# Patient Record
Sex: Male | Born: 1940 | Race: White | Hispanic: No | Marital: Married | Smoking: Never smoker
Health system: Southern US, Community
[De-identification: ages and names within clinical notes are randomized; demographics above are authoritative.]

## PROBLEM LIST (undated history)

## (undated) ENCOUNTER — Emergency Department (HOSPITAL_COMMUNITY): Discharge status: Absent without leave | Payer: Self-pay

## (undated) DIAGNOSIS — I4891 Unspecified atrial fibrillation: Secondary | ICD-10-CM

## (undated) DIAGNOSIS — I10 Essential (primary) hypertension: Secondary | ICD-10-CM

## (undated) HISTORY — PX: ATRIAL ABLATION SURGERY: SHX560

## (undated) HISTORY — PX: CATARACT EXTRACTION: SUR2

## (undated) HISTORY — PX: CARDIOVERSION: SHX1299

## (undated) HISTORY — PX: SHOULDER SURGERY: SHX246

---

## 1998-09-03 ENCOUNTER — Ambulatory Visit (HOSPITAL_COMMUNITY): Admission: RE | Admit: 1998-09-03 | Discharge: 1998-09-03 | Payer: Self-pay | Admitting: General Surgery

## 1999-05-02 ENCOUNTER — Ambulatory Visit (HOSPITAL_COMMUNITY): Admission: RE | Admit: 1999-05-02 | Discharge: 1999-05-02 | Payer: Self-pay | Admitting: Internal Medicine

## 1999-05-05 ENCOUNTER — Ambulatory Visit (HOSPITAL_COMMUNITY): Admission: RE | Admit: 1999-05-05 | Discharge: 1999-05-05 | Payer: Self-pay | Admitting: Internal Medicine

## 2000-04-19 ENCOUNTER — Encounter: Admission: RE | Admit: 2000-04-19 | Discharge: 2000-04-19 | Payer: Self-pay | Admitting: Internal Medicine

## 2000-04-19 ENCOUNTER — Encounter: Payer: Self-pay | Admitting: Internal Medicine

## 2000-07-10 ENCOUNTER — Emergency Department (HOSPITAL_COMMUNITY): Admission: EM | Admit: 2000-07-10 | Discharge: 2000-07-10 | Payer: Self-pay | Admitting: Emergency Medicine

## 2000-08-05 ENCOUNTER — Inpatient Hospital Stay (HOSPITAL_COMMUNITY): Admission: AD | Admit: 2000-08-05 | Discharge: 2000-08-08 | Payer: Self-pay | Admitting: Cardiology

## 2000-08-05 ENCOUNTER — Encounter: Payer: Self-pay | Admitting: Cardiology

## 2001-05-26 ENCOUNTER — Encounter: Payer: Self-pay | Admitting: Family Medicine

## 2001-05-26 ENCOUNTER — Encounter: Admission: RE | Admit: 2001-05-26 | Discharge: 2001-05-26 | Payer: Self-pay | Admitting: Family Medicine

## 2001-07-12 ENCOUNTER — Encounter (INDEPENDENT_AMBULATORY_CARE_PROVIDER_SITE_OTHER): Payer: Self-pay | Admitting: *Deleted

## 2001-07-12 ENCOUNTER — Ambulatory Visit (HOSPITAL_BASED_OUTPATIENT_CLINIC_OR_DEPARTMENT_OTHER): Admission: RE | Admit: 2001-07-12 | Discharge: 2001-07-12 | Payer: Self-pay | Admitting: General Surgery

## 2002-01-03 ENCOUNTER — Encounter: Admission: RE | Admit: 2002-01-03 | Discharge: 2002-04-03 | Payer: Self-pay | Admitting: Family Medicine

## 2002-03-22 ENCOUNTER — Encounter: Admission: RE | Admit: 2002-03-22 | Discharge: 2002-03-22 | Payer: Self-pay | Admitting: Internal Medicine

## 2002-03-22 ENCOUNTER — Encounter: Payer: Self-pay | Admitting: Internal Medicine

## 2002-06-01 ENCOUNTER — Encounter: Payer: Self-pay | Admitting: Internal Medicine

## 2002-06-01 ENCOUNTER — Encounter: Admission: RE | Admit: 2002-06-01 | Discharge: 2002-06-01 | Payer: Self-pay | Admitting: Internal Medicine

## 2003-05-28 ENCOUNTER — Encounter: Admission: RE | Admit: 2003-05-28 | Discharge: 2003-05-28 | Payer: Self-pay | Admitting: Internal Medicine

## 2003-06-22 ENCOUNTER — Ambulatory Visit (HOSPITAL_BASED_OUTPATIENT_CLINIC_OR_DEPARTMENT_OTHER): Admission: RE | Admit: 2003-06-22 | Discharge: 2003-06-22 | Payer: Self-pay | Admitting: Critical Care Medicine

## 2003-09-10 ENCOUNTER — Ambulatory Visit (HOSPITAL_BASED_OUTPATIENT_CLINIC_OR_DEPARTMENT_OTHER): Admission: RE | Admit: 2003-09-10 | Discharge: 2003-09-10 | Payer: Self-pay | Admitting: Critical Care Medicine

## 2004-09-10 ENCOUNTER — Encounter: Admission: RE | Admit: 2004-09-10 | Discharge: 2004-09-10 | Payer: Self-pay | Admitting: Internal Medicine

## 2004-09-16 ENCOUNTER — Ambulatory Visit (HOSPITAL_COMMUNITY): Admission: RE | Admit: 2004-09-16 | Discharge: 2004-09-16 | Payer: Self-pay | Admitting: Orthopedic Surgery

## 2004-09-16 ENCOUNTER — Ambulatory Visit (HOSPITAL_BASED_OUTPATIENT_CLINIC_OR_DEPARTMENT_OTHER): Admission: RE | Admit: 2004-09-16 | Discharge: 2004-09-16 | Payer: Self-pay | Admitting: Orthopedic Surgery

## 2006-07-21 ENCOUNTER — Encounter: Admission: RE | Admit: 2006-07-21 | Discharge: 2006-07-21 | Payer: Self-pay | Admitting: Internal Medicine

## 2007-08-03 ENCOUNTER — Emergency Department (HOSPITAL_COMMUNITY): Admission: EM | Admit: 2007-08-03 | Discharge: 2007-08-03 | Payer: Self-pay | Admitting: Emergency Medicine

## 2007-09-14 ENCOUNTER — Ambulatory Visit (HOSPITAL_BASED_OUTPATIENT_CLINIC_OR_DEPARTMENT_OTHER): Admission: RE | Admit: 2007-09-14 | Discharge: 2007-09-14 | Payer: Self-pay | Admitting: Orthopedic Surgery

## 2010-05-20 NOTE — Op Note (Signed)
NAME:  Jacob Browning, Jacob Browning NO.:  [id number]   MEDICAL RECORD NO.:  [id number]          PATIENT TYPE:  AMB   LOCATION:  DSC                          FACILITY:  [facility]   PHYSICIAN:  Leonides Grills, M.D.     DATE OF BIRTH:  1940-11-16   DATE OF PROCEDURE:  09/14/2007  DATE OF DISCHARGE:                               OPERATIVE REPORT   PREOPERATIVE DIAGNOSIS:  Left hallux valgus with gouty arthropathy.   POSTOPERATIVE DIAGNOSIS:  Left hallux valgus with gouty arthropathy.   OPERATION:  1. Left modified McBride bunionectomy.  2. Great toe digital nerve neurolysis.   ANESTHESIA:  General.   SURGEON:  Leonides Grills, MD   ASSISTANT:  Richardean Canal, PA-C   ESTIMATED BLOOD LOSS:  Minimal.   TOURNIQUET TIME:  Approximately half hour.   COMPLICATIONS:  None.   DISPOSITION:  Stable to PR.   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  He was consented to the above procedure.  All risks of  infection, neurovascular injury, recurrence, persistent pain, worsening  pain, possibility of flare-up of gouty, arthropathy, stiffness, and  arthritis were all explained.  Questions were encouraged and answered.   OPERATION:  The patient was brought to the operating room and placed in  supine position.  After adequate general endotracheal anesthesia was  administered as well as Ancef 1 g IV piggyback, left lower extremity was  prepped and draped in the sterile manner over proximally thigh  tourniquet.  The limb was then gravity exsanguinated and the tourniquet  was inflated to 290 mmHg.  A longitudinal incision over the previous  incision was then made.  Dissection was carefully taken down through the  skin.  Hemostasis was obtained.  The dorsomedial digital nerve was  carefully peeled back within the  scar tissue.  There was a bulk gouty  tophus and prominence within the capsule.  This was carefully dissected  out, trying to preserve as much of the capsule was begun.  Once this was  done, we then made an L-shaped capsulotomy and removed any scar tissue  off the medial aspect of the metatarsal head.  We then made another  longitudinal incision on dorsal aspect of first web space of the left  foot.  Dissection was carried down through the skin.  Hemostasis was  obtained.  A lateral capsular release as well as abductor release and  the abductor tendon to sesamoids was also released as well.  This freed  up the sesamoids nicely and had no tension on the toe at the end of  procedure.  We copiously irrigated the area with normal saline and then  went back to the medial capsule and repaired it with 2-0 Vicryl stitch,  holding the toe in a reduced position.  This had an Conservation officer, historic buildings.  The tourniquet was deflated and hemostasis was obtained.  Wound was  copiously irrigated with normal saline.  Skin was closed with  4-0 nylon  stitch.  Sterile dressing was applied.  Roger-Mann dressing was applied.  Hard sole shoe was applied.  The patient was stable to the PR.      Leonides Grills, M.D.  Electronically Signed     PB/MEDQ  D:  09/14/2007  T:  09/15/2007  Job:  [id number]

## 2010-05-23 NOTE — H&P (Signed)
[place]. [facility]  Patient:    Jacob Browning, Jacob Browning                      MRN: [id number] Adm. Date:  08/05/00 Attending:  Darden Palmer., M.D. CC:         Erskine Speed, M.D.                         History and Physical  DATE OF BIRTH: June 29, 1940  REASON FOR ADMISSION: Start medicines for atrial fibrillation.  HISTORY OF PRESENT ILLNESS: This is a 70 year old male, previously healthy, who had atrial fibrillation found on routine physical in April 2002.  He was asymptomatic at that time with only minimal edema.  He did not use caffeine to excess or excess alcohol.  He had an echocardiogram done on May 03, 2000 with EF of 50% and mild concentric LVH.  The right ventricle was upper limits of normal in size.  Left atrial size was 5.3 cm.  It was recommended he be treated since he was asymptomatic with chronic Coumadin therapy and rate control.  We discussed with him the end of April 2002 about staying on Coumadin and rate control with diltiazem 240 mg q.d.  He returned on July 05, 2000 discussing possible cardioversion after Dr. Chilton Si had felt like attempt to get him back into sinus rhythm might be warranted.  The patient did not have shortness of breath or exercise intolerance, but he was concerned about having to take Coumadin because of restrictions on vitamin K and wanted to know about getting off Coumadin in the future.  He had some finger surgery and after this was done he was back on Coumadin.  He wanted to come into the hospital to initiate drug treatment for treatment of atrial fibrillation to try to see if attempt could be made to get him back into normal sinus rhythm. An extensive discussion was had with him prior to this admission about the need for Coumadin therapy even following restoration of sinus rhythm down the road.  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  No history of hypertension, diabetes, previous  heart disease, or ulcers.  PAST SURGICAL HISTORY: Knee operation.  ALLERGIES: None.  CURRENT MEDICATIONS:  1. Coumadin 7.5 mg q.d.  2. Tiazac 240 mg q.d.  FAMILY HISTORY: Negative for premature heart disease.  Father died at age 84 with lung cancer and TB.  Mother died at age 69 accidentally.  No premature cardiac history.  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  Nonsmoker for the past 18 years, previously smoked for 30 years.  Does not use alcohol to excess.  Is married.  REVIEW OF SYSTEMS: Negative.  PHYSICAL EXAMINATION:  GENERAL: He is an obese, pleasant male in no acute distress.  VITAL SIGNS: Blood pressure 120/80, pulse 76.  SKIN: Warm and dry.  HEENT: Normal.  NECK: No carotid bruits or JVD.  LUNGS: Clear.  CARDIAC: Normal S1 and S2, no S3.  ABDOMEN: Soft, nontender.  No masses or organomegaly.  EXTREMITIES: Pulses 2+, no edema.  LABORATORY DATA: Chest x-ray shows mild cardiomegaly.  A 12 lead ECG shows atrial fibrillation with controlled response.  IMPRESSION:  1. Atrial fibrillation.  2. Obesity.  3. Cardiomegaly on chest x-ray.  PLAN: Admission for institution of antiarrhythmic therapy.  We have discussed Tikosyn with him and felt this would be the best efficacious  drug with lowest side effect profile for him.  We will initiate Tikosyn under monitoring and plan cardioversion once he has been on Tikosyn for an adequate period of time. DD:  08/05/00 TD:  08/06/00 Job: [id number]

## 2010-05-23 NOTE — Procedures (Signed)
NAME:  Jacob Browning, SEIER NO.:  [id number]   MEDICAL RECORD NO.:  [id number]          PATIENT TYPE:  OUT   LOCATION:  SLEEP CENTER                 FACILITY:  [facility]   PHYSICIAN:  Marcelyn Bruins, M.D. [facility] DATE OF BIRTH:  10-06-1940   DATE OF ADMISSION:  09/10/2003  DATE OF DISCHARGE:  09/10/2003                              NOCTURNAL POLYSOMNOGRAM   REFERRING PHYSICIAN:  Dr. Shan Levans, M.D.   INDICATION FOR STUDY:  Hypersomnia with sleep apnea.   The patient returns for a CPAP titration study after being diagnosed with  moderate obstructive sleep apnea.   SLEEP ARCHITECTURE:  The patient had total sleep time of 342 minutes with  decreased REM and slow-wave sleep.  Sleep onset latency was normal as was  REM onset.   IMPRESSION:  1.  Good control of previously diagnosed moderate obstructive sleep apnea      with 10 cm of CPAP.  The patient used his mask from home.  There was no      breakthrough snoring.  2.  No clinically-significant cardiac arrhythmias.  3.  Large numbers of leg jerks with significant sleep disruption.  The      patient also complained of pain in his legs during the study. Clinical      correlation is suggested.      KC/MEDQ  D:  09/27/2003 16:06:01  T:  09/28/2003 17:45:01  Job:  [id number]

## 2010-05-23 NOTE — Op Note (Signed)
[place]. [facility]  Patient:    Jacob Browning, Jacob Browning Visit Number: [id number] MRN: [id number]          Service Type: DSU Location: [facility] Attending Physician:  Henrene Dodge Dictated by:   Anselm Pancoast. Zachery Dakins, M.D. Proc. Date: 07/12/01 Admit Date:  07/12/2001 Discharge Date: 07/12/2001   CC:         Dyanne Carrel, M.D.   Operative Report  PREOPERATIVE DIAGNOSIS:  Internal and external hemorrhoids.  POSTOPERATIVE DIAGNOSIS:  Internal and external hemorrhoids.  OPERATION PERFORMED:  Internal and external hemorrhoidectomy.  SURGEON:  Anselm Pancoast. Zachery Dakins, M.D.  ANESTHESIA:  General.  INDICATIONS FOR PROCEDURE:  The patient is a 70 year old Caucasian male who has had extensive long-term problems with internal and external hemorrhoids, has been on Coumadin because of a history of atrial fibrillation.  This is managed by Dr. Donnie Aho and Dr. Manus Gunning referred him to me for the hemorrhoids approximately two months ago.  At that time, I thought that his really enormous internal and external hemorrhoids were such that really a formal internal and external hemorrhoidectomy was what he needed.  He was able to discontinue the Coumadin approximately 10 days ago and wanted to proceed on with surgery at this time.  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  He has had a good intestinal prep.  He has had a previous colonoscopy about a year ago I think by Dr. Dorena Cookey and I am not planning on doing a procto, just a formal internal and external hemorrhoidectomy.  DESCRIPTION OF PROCEDURE:  The patient was taken to the operating suite. Induction of general anesthesia.  He was given 3 gm of Unasyn and placed up in lithotomy position.  The patient has very large hemorrhoids in both the left and right  quadrants.  The anterior hemorrhoid is really to the left of midline and I marked all three areas with Pennington clamps and then I elected to do the anterior one first.  I had prepped the area with Betadine surgical scrub and solution and draped in a sterile manner and anesthetized the internal sphincter area.  It was approximately 20 cc of 0.25% Marcaine with Adrenalin. The external hemorrhoid anteriorly was first elevated.  The internal sphincter was protected.  I then separated the hemorrhoid off the internal sphincter. The little bleeding areas laterally and exteriorly were closed with 3-0 chromic and then the hemorrhoid after it was elevated on its pedicle was underclamped with a Bowie clamp and the hemorrhoid removed.  I sutured the pedicle with 2-0 chromic and then closed the hemorrhoidectomy incision with this continuous running 2-0 chromic suture.  Next the right lateral which was the largest hemorrhoid was performed in a similar manner and the pedicle was underclamped with the Bowie, the hemorrhoid removed and then after the pedicle had been sutured with 2-0 chromic interrupted sutures, I then closed the hemorrhoidectomy incision with a running 2-0 chromic.  The last over on the left was the medium third middle in size was treated in a similar manner.  At the completion it would admit your index finger easily and anoscope was used to inspect all suture lines with good hemostasis.  Then I placed Xylocaine ointment in the anal canal with a gauze kind of tucked into the anal canal to mild hemostasis.  The patient tolerated the procedure nicely  and will be released after a short stay in the recovery room.  He will have Tylox for pain, Xylocaine ointment if needed and can drive when he is no longer needing the Tylox for pain. Dictated by:   Anselm Pancoast. Zachery Dakins, M.D. Attending Physician:  Henrene Dodge DD:  07/12/01 TD:  07/14/01 Job: [id number]

## 2010-05-23 NOTE — Consult Note (Signed)
[facility]  Patient:    Jacob, Browning Visit Number: [id number] MRN: [id number]          Service Type: EMS Location: ED Attending Physician:  Shelba Flake Proc. Date: 07/10/00 Admit Date:  07/10/2000 Discharge Date: 07/10/2000                            Consultation Report  PHYSICIAN REQUESTING CONSULTATION:  Dr. Earlyne Iba.  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  He presents today with bleeding and loss of sensation and decreased motion on his long and ring fingers on his dominant right hand.  He is an otherwise healthy 70 year old male with no known drug allergies, currently on Coumadin for atrial fibrillation, no recent hospitalizations or surgery and no other significant past medical history.  FAMILY MEDICAL HISTORY:  Noncontributory.  SOCIAL HISTORY:  Noncontributory.  PHYSICAL EXAMINATION:  GENERAL:  Well-developed, well-nourished male, pleasant and alert and oriented x 3.  EXTREMITIES:  On examination of his upper extremity on the right, he has a laceration along the volar aspect of his ring finger in the area of the middle phalanx between the DIP and PIP flexion creases.  He has loss of sensation distal to this and also significant weakness in flexion with probable -- at least partial -- profundus tendon laceration.  Superficialis tendon appears to be intact.  He has a superficial abrasion on the little finger.  Long finger has a distal laceration transversely just distal to the DIP flexion crease but he has full tendon function and minimal loss of sensation.  DESCRIPTION OF PROCEDURE:  He was given 2% plain lidocaine digital sheath blocks.  The wounds were irrigated and debrided under sterile conditions, then loosely closed with  4-0 nylon on the long and ring fingers.  DISPOSITION:  He was discharged from the emergency department with Keflex 500 mg one p.o. q.i.d.  He was given Vicodin for pain.  FOLLOWUP:  He is to follow up in my office on July 20, 2000 for a recheck. DISCHARGE INSTRUCTIONS:  Should he have signs of loss of sensation and loss of flexion, he may require exploration and tendon, nerve and artery repair as necessary.  He is to call my office immediately for any signs of infection, streaking up the arm, adenopathy, fever, chills, etc. Attending Physician:  Shelba Flake DD:  07/10/00 TD:  07/11/00 Job: [id number]

## 2010-05-23 NOTE — Op Note (Signed)
NAME:  Jacob Browning, Jacob Browning NO.:  [id number]   MEDICAL RECORD NO.:  [id number]          PATIENT TYPE:  AMB   LOCATION:  DSC                          FACILITY:  [facility]   PHYSICIAN:  Leonides Grills, M.D.     DATE OF BIRTH:  Aug 24, 1940   DATE OF PROCEDURE:  09/16/2004  DATE OF DISCHARGE:                                 OPERATIVE REPORT   PREOPERATIVE DIAGNOSIS:  1.  Left hallux valgus.  2.  Left third metatarsalgia.  3.  Left third hammer toe.   POSTOPERATIVE DIAGNOSIS:  1.  Left hallux valgus.  2.  Left third metatarsalgia.  3.  Left third hammer toe.   OPERATION:  1.  Left Chevron bunionectomy.  2.  Stress x-rays of the left foot.  3.  Left third metatarsal shortening dorsiflexion osteotomy.  4.  Left third toe MTP joint dorsal capsulotomy with collateral release.  5.  Left third toe EDP TEL tendon transfer.   ANESTHESIA:  General with block.   SURGEON:  Leonides Grills, M.D.   ASSISTANT:  Lianne Cure, P.A.-C.   ESTIMATED BLOOD LOSS:  Minimal.   TOURNIQUET TIME:  Approximately one hour.   COMPLICATIONS:  None.   DISPOSITION:  Stable to the PR.   INDICATIONS FOR PROCEDURE:  This is a 70 year old male with long-standing  forefoot pain since a [place] war injury.  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  He was consented for the above  procedure.  All risks which included infection, neurovascular injury,  nonunion, malunion, hardware rotation, hardware failure, persistent pain,  worsening pain, prolonged recovery, stiffness, arthritis, were all  explained.  Questions were encouraged and answered.   OPERATION:  The patient was brought to the operating room and placed in  supine position.  After adequate general anesthesia was administered as well  as Ancef 1 gram IV piggyback, the left lower extremity was then prepped and  draped in a sterile manner  over a proximally placed thigh tourniquet.  The  limb was gravity exsanguinated and tourniquet elevated to 290 mmHg.  A  longitudinal incision on the medial midline aspect of the great toe MTP  joint was then made, dissection was carried down through the skin, and  hemostasis was obtained.  Neurovascular sections were identified both  superiorly and inferiorly and protected throughout the case.  An L-shaped  capsulotomy was then performed.  The capsule was very tight.  Simple  bunionectomy was then performed with a sagittal saw.  Lateral capsule was  then released but this took quite sometime due to the fact that the capsule  was so tight and there was almost looked to be tophaceous type material  around this area.  Once this was completely removed and loose, we then  elevated the soft tissues both superiorly and inferiorly, protected them,  and performed the Chevron osteotomy centering in the medial aspect of the  metatarsal head.  The head was then translated approximately 3 mm laterally  and then fixed with 2 mm  full threaded cortical set screw using 1.5 mm drill  holes respectively.  This was counter sunk.  Redundant bone medially was  then osteotomized off the sagittal saw.  The Alona Bene ridge was then  rounded off with a rongeur.  We then copiously irrigated the joint and area  with normal saline.  The capsule was then advanced both superiorly and  proximally repaired with 2-0 Vicryl suture.  We then obtained stress x-rays  in the AP and lateral planes, this showed that there was no gross motion  across the osteotomy site, fixation proper position, toe was in excellent  alignment, as well.  The area was copiously irrigated with saline.   I then made a longitudinal incision over the third metatarsal head,  dissection was carried down through skin and hemostasis was obtained.  The  extensor digitorum longus and brevis tendons were identified and there was a  lot of scar tissue in  this area.  Again, the tendons were freed up and then  the extensor digitorum brevis was tenotomized proximal medial and the brevis  distal lateral retracted out of harms way for later tendon transfer.  We  then performed a dorsal capsulotomy with collateral release with a 15 blade  scalpel.  This had excellent release to the entire capsule.  We then plantar  flexed the toe.  Our goal for this procedure was to not only slightly  shorten the metatarsal but also dorsiflex, it is why we did an oblique  closing wedge osteotomy using stacked sagittal saw blades.  From  preoperative planning, it was thought that the head was approximately 6 mm  depressed.  We took three shavings in an oblique plane of the metatarsal  shaft and then repaired the head and the osteotomy with a 2-0 fully threaded  cortical set screw using 2 and 1.5 mm drill holes respectively essentially  creating a lag screw affect across the osteotomy.  This had an excellent  purchase and maintenance of the correction.  The head was palpated through  the skin plantar and was completely decompressed where it was prominent  previously.  The area and joint were copiously irrigated with normal saline.  We then completed the transfer of the EDP TEL using a 3-0 PDS suture.  Final  x-rays were obtained in the AP, lateral, and oblique planes, and showed that  the fixation was in the proper position, no gross motion across the  osteotomy sites with stress, as well.   The tourniquet was deflated, hemostasis was obtained.  All wounds were  copiously irrigated with normal saline.  The subcu was closed with 3-0  Vicryl, skin was closed with 4-0 nylon.  A sterile dressing was applied.  A  Roger Man dressing was applied.  Hartzel shoes.  Patient stable to the PR.      Leonides Grills, M.D.  Electronically Signed     PB/MEDQ  D:  09/16/2004  T:  09/16/2004  Job:  [id number]

## 2010-05-23 NOTE — Discharge Summary (Signed)
[place]. [facility]  Patient:    Jacob Browning, Jacob Browning                    MRN: [id number] Adm. Date:  [id number] Disc. Date: [id number] Attending:  Norman Clay CC:         Erskine Speed, M.D.   Discharge Summary  FINAL DIAGNOSIS:  Atrial fibrillation - resolved.  HISTORY OF PRESENT ILLNESS:  The patient is a 70 year old male previously healthy who had atrial fibrillation.  He had a routine physical examination in April of 2002.  Echocardiogram showed mild concentric LVH, with left atrial size of 5.3.  He was begun on Coumadin and rate control at that time since he was asymptomatic.  He later wished to have attempts to get back into sinus rhythm and was brought into the hospital to initiate antiarrhythmic therapy. Please see the previously dictated history and physical for the remainder of the details.  HOSPITAL COURSE:  X-ray shows mild cardiac enlargement.  Initial EKG showed normal QTC, atrial fibrillation, controlled response.  Protime/INR was 2.2, magnesium was 2, sodium 139, potassium 4.4, BUN and creatinine normal.  CBC was normal.  The patient was brought into the hospital and initiated on Tikosyn 500 mg q.12h.  per protocol. QTC did not prolong significantly.  He converted back to sinus rhythm after the first dose and was watched in the hospital for three days, had no proarrhythmia on it.  He was discharged in improved condition on Tikosyn 500 mg q.12h. and Coumadin 7.5 mg daily.  He is to stop metoprolol 50 b.i.d.  He was instructed regarding Tikosyn.  He was instructed not to use cimetidine or other contraindicated drugs such as verapamil, ketoconazole, trimethoprim Sulphamethoxazole, or HCTZ.  He is to see me in the office for follow-up appointment in two weeks.  He is to walk daily. After a month on Coumadin, since he does not have any other risk factors for embolism, we may consider stopping his Coumadin and place him on aspirin  alone. DD:  08/08/00 TD:  08/10/00 Job: [id number]

## 2010-06-12 ENCOUNTER — Ambulatory Visit
Admission: RE | Admit: 2010-06-12 | Discharge: 2010-06-12 | Disposition: A | Discharge status: Home / Self Care | Payer: Medicare Other | Source: Ambulatory Visit | Attending: Internal Medicine | Admitting: Internal Medicine

## 2010-06-12 ENCOUNTER — Other Ambulatory Visit: Payer: Self-pay | Admitting: Internal Medicine

## 2010-06-12 DIAGNOSIS — R05 Cough: Secondary | ICD-10-CM

## 2010-06-12 DIAGNOSIS — M79673 Pain in unspecified foot: Secondary | ICD-10-CM

## 2010-10-08 LAB — APTT: aPTT: 28

## 2010-10-08 LAB — BASIC METABOLIC PANEL
Calcium: 8.9
GFR calc non Af Amer: >60
Glucose, Bld: 91
Sodium: 139

## 2010-12-19 ENCOUNTER — Other Ambulatory Visit: Payer: Self-pay | Admitting: Gastroenterology

## 2014-01-18 DIAGNOSIS — D3132 Benign neoplasm of left choroid: Secondary | ICD-10-CM | POA: Diagnosis not present

## 2014-01-30 DIAGNOSIS — I10 Essential (primary) hypertension: Secondary | ICD-10-CM | POA: Diagnosis not present

## 2014-01-30 DIAGNOSIS — I480 Paroxysmal atrial fibrillation: Secondary | ICD-10-CM | POA: Diagnosis not present

## 2014-01-30 DIAGNOSIS — I4891 Unspecified atrial fibrillation: Secondary | ICD-10-CM | POA: Diagnosis not present

## 2014-01-30 DIAGNOSIS — Z87891 Personal history of nicotine dependence: Secondary | ICD-10-CM | POA: Diagnosis not present

## 2014-01-30 DIAGNOSIS — Z7901 Long term (current) use of anticoagulants: Secondary | ICD-10-CM | POA: Diagnosis not present

## 2014-01-30 DIAGNOSIS — R002 Palpitations: Secondary | ICD-10-CM | POA: Diagnosis not present

## 2014-02-16 DIAGNOSIS — L820 Inflamed seborrheic keratosis: Secondary | ICD-10-CM | POA: Diagnosis not present

## 2014-02-16 DIAGNOSIS — L570 Actinic keratosis: Secondary | ICD-10-CM | POA: Diagnosis not present

## 2014-02-16 DIAGNOSIS — B079 Viral wart, unspecified: Secondary | ICD-10-CM | POA: Diagnosis not present

## 2014-02-16 DIAGNOSIS — L821 Other seborrheic keratosis: Secondary | ICD-10-CM | POA: Diagnosis not present

## 2014-03-20 DIAGNOSIS — M25612 Stiffness of left shoulder, not elsewhere classified: Secondary | ICD-10-CM | POA: Diagnosis not present

## 2014-04-17 DIAGNOSIS — B079 Viral wart, unspecified: Secondary | ICD-10-CM | POA: Diagnosis not present

## 2014-06-12 DIAGNOSIS — I480 Paroxysmal atrial fibrillation: Secondary | ICD-10-CM | POA: Diagnosis not present

## 2014-06-12 DIAGNOSIS — Z87891 Personal history of nicotine dependence: Secondary | ICD-10-CM | POA: Diagnosis not present

## 2014-06-12 DIAGNOSIS — Z5181 Encounter for therapeutic drug level monitoring: Secondary | ICD-10-CM | POA: Diagnosis not present

## 2014-06-12 DIAGNOSIS — I10 Essential (primary) hypertension: Secondary | ICD-10-CM | POA: Diagnosis not present

## 2014-06-12 DIAGNOSIS — I4891 Unspecified atrial fibrillation: Secondary | ICD-10-CM | POA: Diagnosis not present

## 2014-06-12 DIAGNOSIS — Z79899 Other long term (current) drug therapy: Secondary | ICD-10-CM | POA: Diagnosis not present

## 2014-06-29 DIAGNOSIS — M109 Gout, unspecified: Secondary | ICD-10-CM | POA: Diagnosis not present

## 2014-06-29 DIAGNOSIS — I4891 Unspecified atrial fibrillation: Secondary | ICD-10-CM | POA: Diagnosis not present

## 2014-06-29 DIAGNOSIS — Z6834 Body mass index (BMI) 34.0-34.9, adult: Secondary | ICD-10-CM | POA: Diagnosis not present

## 2014-06-29 DIAGNOSIS — Z0000 Encounter for general adult medical examination without abnormal findings: Secondary | ICD-10-CM | POA: Diagnosis not present

## 2014-07-02 DIAGNOSIS — I10 Essential (primary) hypertension: Secondary | ICD-10-CM | POA: Diagnosis not present

## 2014-07-13 DIAGNOSIS — I10 Essential (primary) hypertension: Secondary | ICD-10-CM | POA: Diagnosis not present

## 2014-07-20 DIAGNOSIS — I10 Essential (primary) hypertension: Secondary | ICD-10-CM | POA: Diagnosis not present

## 2014-07-20 DIAGNOSIS — I4891 Unspecified atrial fibrillation: Secondary | ICD-10-CM | POA: Diagnosis not present

## 2014-10-19 DIAGNOSIS — R001 Bradycardia, unspecified: Secondary | ICD-10-CM | POA: Diagnosis not present

## 2014-10-19 DIAGNOSIS — Z23 Encounter for immunization: Secondary | ICD-10-CM | POA: Diagnosis not present

## 2014-10-19 DIAGNOSIS — I10 Essential (primary) hypertension: Secondary | ICD-10-CM | POA: Diagnosis not present

## 2014-11-19 ENCOUNTER — Emergency Department (HOSPITAL_BASED_OUTPATIENT_CLINIC_OR_DEPARTMENT_OTHER)
Admission: EM | Admit: 2014-11-19 | Discharge: 2014-11-19 | Disposition: A | Discharge status: Home / Self Care | Payer: Medicare Other | Attending: Emergency Medicine | Admitting: Emergency Medicine

## 2014-11-19 ENCOUNTER — Encounter (HOSPITAL_BASED_OUTPATIENT_CLINIC_OR_DEPARTMENT_OTHER): Payer: Self-pay

## 2014-11-19 ENCOUNTER — Emergency Department (HOSPITAL_BASED_OUTPATIENT_CLINIC_OR_DEPARTMENT_OTHER): Payer: Medicare Other

## 2014-11-19 DIAGNOSIS — Z7902 Long term (current) use of antithrombotics/antiplatelets: Secondary | ICD-10-CM | POA: Insufficient documentation

## 2014-11-19 DIAGNOSIS — S0083XA Contusion of other part of head, initial encounter: Secondary | ICD-10-CM | POA: Insufficient documentation

## 2014-11-19 DIAGNOSIS — W01198A Fall on same level from slipping, tripping and stumbling with subsequent striking against other object, initial encounter: Secondary | ICD-10-CM | POA: Diagnosis not present

## 2014-11-19 DIAGNOSIS — S0990XA Unspecified injury of head, initial encounter: Secondary | ICD-10-CM

## 2014-11-19 DIAGNOSIS — I4891 Unspecified atrial fibrillation: Secondary | ICD-10-CM | POA: Diagnosis not present

## 2014-11-19 DIAGNOSIS — Y998 Other external cause status: Secondary | ICD-10-CM | POA: Diagnosis not present

## 2014-11-19 DIAGNOSIS — Y9289 Other specified places as the place of occurrence of the external cause: Secondary | ICD-10-CM | POA: Insufficient documentation

## 2014-11-19 DIAGNOSIS — I10 Essential (primary) hypertension: Secondary | ICD-10-CM | POA: Diagnosis not present

## 2014-11-19 DIAGNOSIS — Y9301 Activity, walking, marching and hiking: Secondary | ICD-10-CM | POA: Insufficient documentation

## 2014-11-19 DIAGNOSIS — Z79899 Other long term (current) drug therapy: Secondary | ICD-10-CM | POA: Insufficient documentation

## 2014-11-19 DIAGNOSIS — W19XXXA Unspecified fall, initial encounter: Secondary | ICD-10-CM

## 2014-11-19 HISTORY — DX: Essential (primary) hypertension: I10

## 2014-11-19 HISTORY — DX: Unspecified atrial fibrillation: I48.91

## 2014-11-19 NOTE — ED Notes (Signed)
Pt tripped and fell hit back of head on side of house  No loc  Hematoma noted  Pupils 3 and brisk    Ice applied to head

## 2014-11-19 NOTE — ED Provider Notes (Signed)
CSN: [id number]     Arrival date & time 11/19/14  1837 History   First MD Initiated Contact with Patient 11/19/14 2039     Chief Complaint  Patient presents with  . Head Injury   Jacob Browning is a 74 y.o. male with a history of atrophic fibrillation and hypertension and is on Eliquis who presents to the emergency department after a trip and fall hitting the back of his head around 5:30 PM tonight. Patient reports he was walking backwards when he tripped and fell backwards onto the side of his house being the back of his head on the side of the house. He denies loss of consciousness. He currently complains of a 3/10 headache directly where he hit the back of his head. He denies feeling lightheaded or dizzy. He has taken nothing for treatment today. The patient denies fevers, recent illness, neck pain, numbness, tingling, weakness, double vision, changes to his vision, chest pain, shortness of breath, palpitations, abdominal pain, nausea, vomiting, diarrhea, new back pain, urinary symptoms, lightheadedness, dizziness, or syncope. He denies hematuria, easy bruising, easy bleeding, gum bleeding, or hematochezia. He denies loss of bowel or bladder control.  (Consider location/radiation/quality/duration/timing/severity/associated sxs/prior Treatment) HPI  Past Medical History  Diagnosis Date  . A-fib ([place])   . Hypertension    Past Surgical History  Procedure Laterality Date  . Atrial ablation surgery     No family history on file. Social History  Substance Use Topics  . Smoking status: Never Smoker   . Smokeless tobacco: None  . Alcohol Use: No    Review of Systems  Constitutional: Negative for fever and chills.  HENT: Negative for ear discharge, ear pain, hearing loss, nosebleeds and tinnitus.   Eyes: Negative for pain and visual disturbance.  Respiratory: Negative for cough, shortness of breath and wheezing.   Cardiovascular: Negative for chest pain and palpitations.   Gastrointestinal: Negative for nausea, vomiting, abdominal pain, diarrhea and blood in stool.  Genitourinary: Negative for dysuria, hematuria and difficulty urinating.  Musculoskeletal: Negative for back pain, neck pain and neck stiffness.  Skin: Negative for rash and wound.  Neurological: Positive for headaches. Negative for dizziness, seizures, syncope, speech difficulty, weakness, light-headedness and numbness.      Allergies  Review of patient's allergies indicates no known allergies.  Home Medications   Prior to Admission medications   Medication Sig Start Date End Date Taking? Authorizing Provider  apixaban (ELIQUIS) 5 MG TABS tablet Take 5 mg by mouth 2 (two) times daily.   Yes Historical Provider, MD  METOPROLOL TARTRATE PO Take by mouth.   Yes Historical Provider, MD  sotalol (BETAPACE) 120 MG tablet Take 120 mg by mouth 2 (two) times daily.   Yes Historical Provider, MD   BP 159/82 mmHg  Pulse 51  Temp(Src) 98.4 F (36.9 C) (Oral)  Resp 18  Ht 6\' 1"  (1.854 m)  Wt 246 lb (111.585 kg)  BMI 32.46 kg/m2  SpO2 93% Physical Exam  Constitutional: He is oriented to person, place, and time. He appears well-developed and well-nourished. No distress.  HENT:  Head: Normocephalic.  Right Ear: External ear normal.  Left Ear: External ear normal.  Nose: Nose normal.  Mouth/Throat: Oropharynx is clear and moist.  3 cm hematoma to his posterior head. No crepitus. No other evidence of head trauma. No bleeding, abrasions or lacerations.  Bilateral tympanic membranes are pearly-gray without erythema or loss of landmarks. No ear discharge.  Eyes: Conjunctivae and EOM are normal. Pupils are  equal, round, and reactive to light. Right eye exhibits no discharge. Left eye exhibits no discharge.  EOMs are intact bilaterally.  Neck: Normal range of motion. Neck supple. No JVD present. No tracheal deviation present.  No midline neck tenderness to palpation.  Cardiovascular: Normal rate,  regular rhythm, normal heart sounds and intact distal pulses.  Exam reveals no gallop and no friction rub.   No murmur heard. Pulmonary/Chest: Effort normal and breath sounds normal. No respiratory distress. He has no wheezes. He has no rales. He exhibits no tenderness.  Abdominal: Soft. He exhibits no distension. There is no tenderness. There is no guarding.  Musculoskeletal: Normal range of motion. He exhibits no edema or tenderness.  No midline neck or back tenderness. 5 out of 5 strength in his bilateral upper and lower extremities.  Lymphadenopathy:    He has no cervical adenopathy.  Neurological: He is alert and oriented to person, place, and time. No cranial nerve deficit. Coordination normal.  The patient is alert and oriented 3. Cranial nerves are intact. No pronator drift. Finger to nose intact bilaterally. EOMs intact bilaterally. Vision is grossly intact. Sensation is intact in his bilateral upper and lower extremities. Gait is normal. Speech is clear and coherent.  Skin: Skin is warm and dry. No rash noted. He is not diaphoretic. No erythema. No pallor.  Psychiatric: He has a normal mood and affect. His behavior is normal.  Nursing note and vitals reviewed.   ED Course  Procedures (including critical care time) Labs Review Labs Reviewed - No data to display  Imaging Review Ct Head Wo Contrast  11/19/2014  CLINICAL DATA:  Pt states that he was standing still and fell backwards hitting back of head against brick wall around 530pm pt states that he is on blood thinners, denies loc EXAM: CT HEAD WITHOUT CONTRAST TECHNIQUE: Contiguous axial images were obtained from the base of the skull through the vertex without intravenous contrast. COMPARISON:  04/11/2005 FINDINGS: There is no evidence of acute intracranial hemorrhage, brain edema, mass lesion, acute infarction, mass effect, or midline shift. Acute infarct may be inapparent on noncontrast CT. No other intra-axial abnormalities are  seen, and the ventricles and sulci are within normal limits in size and symmetry. No abnormal extra-axial fluid collections or masses are identified. No significant calvarial abnormality. IMPRESSION: 1. Negative for bleed or other acute intracranial process. Electronically Signed   By: Lucrezia Europe M.D.   On: 11/19/2014 19:13   I have personally reviewed and evaluated these images as part of my medical decision-making.   EKG Interpretation None      Filed Vitals:   11/19/14 1848 11/19/14 1953  BP: 168/77 159/82  Pulse: 54 51  Temp: 98.4 F (36.9 C)   TempSrc: Oral   Resp: 16 18  Height: 6\' 1"  (1.854 m)   Weight: 246 lb (111.585 kg)   SpO2: 100% 93%     MDM   Meds given in ED:  Medications - No data to display  New Prescriptions   No medications on file    Final diagnoses:  Closed head injury, initial encounter  Fall, initial encounter   This  is a 74 y.o. male with a history of atrophic fibrillation and hypertension and is on Eliquis who presents to the emergency department after a trip and fall hitting the back of his head around 5:30 PM tonight. Patient reports he was walking backwards when he tripped and fell backwards onto the side of his house being the  back of his head on the side of the house. He denies loss of consciousness. He currently complains of a 3/10 headache directly where he hit the back of his head. He denies feeling lightheaded or dizzy.  On exam the patient is afebrile nontoxic appearing. He has no focal neurological deficits. He has a 3 cm hematoma to his posterior head. No evidence of other head injury. No midline back or neck tenderness. Patient's head CT is unremarkable. Will discharge with strict return precautions. I advised head injury return precautions. I advised the patient to follow-up with their primary care provider this week. I advised the patient to return to the emergency department with new or worsening symptoms or new concerns. The patient  verbalized understanding and agreement with plan.     This patient was discussed with and evaluated by Dr. Alfonse Spruce who agrees with this assessment and plan.    Waynetta Pean, PA-C 11/19/14 2113  Harvel Quale, MD 11/20/14 0800

## 2014-11-19 NOTE — ED Notes (Addendum)
Tripped/fell approx 530pm-hit back of head-no break in skin-hematoma noted-denies LOC-steady gait

## 2014-11-19 NOTE — Discharge Instructions (Signed)
Head Injury, Adult You have received a head injury. It does not appear serious at this time. Headaches and vomiting are common following head injury. It should be easy to awaken from sleeping. Sometimes it is necessary for you to stay in the emergency department for a while for observation. Sometimes admission to the hospital may be needed. After injuries such as yours, most problems occur within the first 24 hours, but side effects may occur up to 7-10 days after the injury. It is important for you to carefully monitor your condition and contact your health care provider or seek immediate medical care if there is a change in your condition. WHAT ARE THE TYPES OF HEAD INJURIES? Head injuries can be as minor as a bump. Some head injuries can be more severe. More severe head injuries include:  A jarring injury to the brain (concussion).  A bruise of the brain (contusion). This mean there is bleeding in the brain that can cause swelling.  A cracked skull (skull fracture).  Bleeding in the brain that collects, clots, and forms a bump (hematoma). WHAT CAUSES A HEAD INJURY? A serious head injury is most likely to happen to someone who is in a car wreck and is not wearing a seat belt. Other causes of major head injuries include bicycle or motorcycle accidents, sports injuries, and falls. HOW ARE HEAD INJURIES DIAGNOSED? A complete history of the event leading to the injury and your current symptoms will be helpful in diagnosing head injuries. Many times, pictures of the brain, such as CT or MRI are needed to see the extent of the injury. Often, an overnight hospital stay is necessary for observation.  WHEN SHOULD I SEEK IMMEDIATE MEDICAL CARE?  You should get help right away if:  You have confusion or drowsiness.  You feel sick to your stomach (nauseous) or have continued, forceful vomiting.  You have dizziness or unsteadiness that is getting worse.  You have severe, continued headaches not  relieved by medicine. Only take over-the-counter or prescription medicines for pain, fever, or discomfort as directed by your health care provider.  You do not have normal function of the arms or legs or are unable to walk.  You notice changes in the black spots in the center of the colored part of your eye (pupil).  You have a clear or bloody fluid coming from your nose or ears.  You have a loss of vision. During the next 24 hours after the injury, you must stay with someone who can watch you for the warning signs. This person should contact local emergency services (911 in the [place] if you have seizures, you become unconscious, or you are unable to wake up. HOW CAN I PREVENT A HEAD INJURY IN THE FUTURE? The most important factor for preventing major head injuries is avoiding motor vehicle accidents. To minimize the potential for damage to your head, it is crucial to wear seat belts while riding in motor vehicles. Wearing helmets while bike riding and playing collision sports (like football) is also helpful. Also, avoiding dangerous activities around the house will further help reduce your risk of head injury.  WHEN CAN I RETURN TO NORMAL ACTIVITIES AND ATHLETICS? You should be reevaluated by your health care provider before returning to these activities. If you have any of the following symptoms, you should not return to activities or contact sports until 1 week after the symptoms have stopped:  Persistent headache.  Dizziness or vertigo.  Poor attention and concentration.  Confusion.  Memory problems. °· Nausea or vomiting. °· Fatigue or tire easily. °· Irritability. °· Intolerant of bright lights or loud noises. °· Anxiety or depression. °· Disturbed sleep. °MAKE SURE YOU:  °· Understand these instructions. °· Will watch your condition. °· Will get help right away if you are not doing well or get worse. °  °This information is not intended to replace advice given to you by your health  care provider. Make sure you discuss any questions you have with your health care provider. °  °Document Released: 12/22/2004 Document Revised: 01/12/2014 Document Reviewed: 08/29/2012 °Elsevier Interactive Patient Education ©2016 [employer] ° ° ° °Hematoma °A hematoma is a collection of blood under the skin, in an organ, in a body space, in a joint space, or in other tissue. The blood can clot to form a lump that you can see and feel. The lump is often firm and may sometimes become sore and tender. Most hematomas get better in a few days to weeks. However, some hematomas may be serious and require medical care. Hematomas can range in size from very small to very large. °CAUSES  °A hematoma can be caused by a blunt or penetrating injury. It can also be caused by spontaneous leakage from a blood vessel under the skin. Spontaneous leakage from a blood vessel is more likely to occur in older people, especially those taking blood thinners. Sometimes, a hematoma can develop after certain medical procedures. °SIGNS AND SYMPTOMS  °· A firm lump on the body. °· Possible pain and tenderness in the area. °· Bruising. Blue, dark blue, purple-red, or yellowish skin may appear at the site of the hematoma if the hematoma is close to the surface of the skin. °For hematomas in deeper tissues or body spaces, the signs and symptoms may be subtle. For example, an intra-abdominal hematoma may cause abdominal pain, weakness, fainting, and shortness of breath. An intracranial hematoma may cause a headache or symptoms such as weakness, trouble speaking, or a change in consciousness. °DIAGNOSIS  °A hematoma can usually be diagnosed based on your medical history and a physical exam. Imaging tests may be needed if your health care provider suspects a hematoma in deeper tissues or body spaces, such as the abdomen, head, or chest. These tests may include ultrasonography or a CT scan.  °TREATMENT  °Hematomas usually go away on their own over  time. Rarely does the blood need to be drained out of the body. Large hematomas or those that may affect vital organs will sometimes need surgical drainage or monitoring. °HOME CARE INSTRUCTIONS  °· Apply ice to the injured area:   °¨ Put ice in a plastic bag.   °¨ Place a towel between your skin and the bag.   °¨ Leave the ice on for 20 minutes, 2-3 times a day for the first 1 to 2 days.   °· After the first 2 days, switch to using warm compresses on the hematoma.   °· Elevate the injured area to help decrease pain and swelling. Wrapping the area with an elastic bandage may also be helpful. Compression helps to reduce swelling and promotes shrinking of the hematoma. Make sure the bandage is not wrapped too tight.   °· If your hematoma is on a lower extremity and is painful, crutches may be helpful for a couple days.   °· Only take over-the-counter or prescription medicines as directed by your health care provider. °SEEK IMMEDIATE MEDICAL CARE IF:  °· You have increasing pain, or your pain is not controlled with medicine.   °·   You have a fever.   °· You have worsening swelling or discoloration.   °· Your skin over the hematoma breaks or starts bleeding.   °· Your hematoma is in your chest or abdomen and you have weakness, shortness of breath, or a change in consciousness. °· Your hematoma is on your scalp (caused by a fall or injury) and you have a worsening headache or a change in alertness or consciousness. °MAKE SURE YOU:  °· Understand these instructions. °· Will watch your condition. °· Will get help right away if you are not doing well or get worse. °  °This information is not intended to replace advice given to you by your health care provider. Make sure you discuss any questions you have with your health care provider. °  °Document Released: 08/06/2003 Document Revised: 08/24/2012 Document Reviewed: 06/01/2012 °Elsevier Interactive Patient Education ©2016 [employer] ° °

## 2014-12-12 DIAGNOSIS — I10 Essential (primary) hypertension: Secondary | ICD-10-CM | POA: Diagnosis not present

## 2014-12-12 DIAGNOSIS — Z87891 Personal history of nicotine dependence: Secondary | ICD-10-CM | POA: Diagnosis not present

## 2014-12-12 DIAGNOSIS — I4891 Unspecified atrial fibrillation: Secondary | ICD-10-CM | POA: Diagnosis not present

## 2014-12-12 DIAGNOSIS — Z79899 Other long term (current) drug therapy: Secondary | ICD-10-CM | POA: Diagnosis not present

## 2014-12-12 DIAGNOSIS — Z7901 Long term (current) use of anticoagulants: Secondary | ICD-10-CM | POA: Diagnosis not present

## 2014-12-12 DIAGNOSIS — I480 Paroxysmal atrial fibrillation: Secondary | ICD-10-CM | POA: Diagnosis not present

## 2014-12-12 DIAGNOSIS — Z5181 Encounter for therapeutic drug level monitoring: Secondary | ICD-10-CM | POA: Diagnosis not present

## 2014-12-12 DIAGNOSIS — I444 Left anterior fascicular block: Secondary | ICD-10-CM | POA: Diagnosis not present

## 2014-12-12 DIAGNOSIS — R001 Bradycardia, unspecified: Secondary | ICD-10-CM | POA: Diagnosis not present

## 2014-12-12 DIAGNOSIS — I440 Atrioventricular block, first degree: Secondary | ICD-10-CM | POA: Diagnosis not present

## 2014-12-27 DIAGNOSIS — H903 Sensorineural hearing loss, bilateral: Secondary | ICD-10-CM | POA: Diagnosis not present

## 2015-02-22 DIAGNOSIS — I4891 Unspecified atrial fibrillation: Secondary | ICD-10-CM | POA: Diagnosis not present

## 2015-02-22 DIAGNOSIS — I10 Essential (primary) hypertension: Secondary | ICD-10-CM | POA: Diagnosis not present

## 2015-04-11 DIAGNOSIS — I119 Hypertensive heart disease without heart failure: Secondary | ICD-10-CM | POA: Diagnosis not present

## 2015-04-11 DIAGNOSIS — I4891 Unspecified atrial fibrillation: Secondary | ICD-10-CM | POA: Diagnosis not present

## 2015-04-17 DIAGNOSIS — I10 Essential (primary) hypertension: Secondary | ICD-10-CM | POA: Diagnosis not present

## 2015-04-17 DIAGNOSIS — I452 Bifascicular block: Secondary | ICD-10-CM | POA: Diagnosis not present

## 2015-04-17 DIAGNOSIS — I480 Paroxysmal atrial fibrillation: Secondary | ICD-10-CM | POA: Diagnosis not present

## 2015-04-17 DIAGNOSIS — I440 Atrioventricular block, first degree: Secondary | ICD-10-CM | POA: Diagnosis not present

## 2015-04-17 DIAGNOSIS — Z7901 Long term (current) use of anticoagulants: Secondary | ICD-10-CM | POA: Diagnosis not present

## 2015-04-17 DIAGNOSIS — I4891 Unspecified atrial fibrillation: Secondary | ICD-10-CM | POA: Diagnosis not present

## 2015-04-17 DIAGNOSIS — Z87891 Personal history of nicotine dependence: Secondary | ICD-10-CM | POA: Diagnosis not present

## 2015-04-17 DIAGNOSIS — Z5181 Encounter for therapeutic drug level monitoring: Secondary | ICD-10-CM | POA: Diagnosis not present

## 2015-04-17 DIAGNOSIS — Z79899 Other long term (current) drug therapy: Secondary | ICD-10-CM | POA: Diagnosis not present

## 2015-07-02 DIAGNOSIS — I152 Hypertension secondary to endocrine disorders: Secondary | ICD-10-CM | POA: Diagnosis not present

## 2015-07-02 DIAGNOSIS — D559 Anemia due to enzyme disorder, unspecified: Secondary | ICD-10-CM | POA: Diagnosis not present

## 2015-07-02 DIAGNOSIS — Z125 Encounter for screening for malignant neoplasm of prostate: Secondary | ICD-10-CM | POA: Diagnosis not present

## 2015-10-16 DIAGNOSIS — Z87891 Personal history of nicotine dependence: Secondary | ICD-10-CM | POA: Diagnosis not present

## 2015-10-16 DIAGNOSIS — I4891 Unspecified atrial fibrillation: Secondary | ICD-10-CM | POA: Diagnosis not present

## 2015-10-16 DIAGNOSIS — Z7901 Long term (current) use of anticoagulants: Secondary | ICD-10-CM | POA: Diagnosis not present

## 2015-10-16 DIAGNOSIS — I10 Essential (primary) hypertension: Secondary | ICD-10-CM | POA: Diagnosis not present

## 2015-10-16 DIAGNOSIS — Z79899 Other long term (current) drug therapy: Secondary | ICD-10-CM | POA: Diagnosis not present

## 2015-10-16 DIAGNOSIS — I480 Paroxysmal atrial fibrillation: Secondary | ICD-10-CM | POA: Diagnosis not present

## 2015-10-16 DIAGNOSIS — I440 Atrioventricular block, first degree: Secondary | ICD-10-CM | POA: Diagnosis not present

## 2015-10-16 DIAGNOSIS — Z5181 Encounter for therapeutic drug level monitoring: Secondary | ICD-10-CM | POA: Diagnosis not present

## 2015-10-16 DIAGNOSIS — I447 Left bundle-branch block, unspecified: Secondary | ICD-10-CM | POA: Diagnosis not present

## 2016-04-05 ENCOUNTER — Emergency Department (HOSPITAL_BASED_OUTPATIENT_CLINIC_OR_DEPARTMENT_OTHER): Payer: Medicare Other

## 2016-04-05 ENCOUNTER — Encounter (HOSPITAL_BASED_OUTPATIENT_CLINIC_OR_DEPARTMENT_OTHER): Payer: Self-pay | Admitting: Emergency Medicine

## 2016-04-05 ENCOUNTER — Emergency Department (HOSPITAL_BASED_OUTPATIENT_CLINIC_OR_DEPARTMENT_OTHER)
Admission: EM | Admit: 2016-04-05 | Discharge: 2016-04-05 | Disposition: A | Discharge status: Home / Self Care | Payer: Medicare Other | Attending: Emergency Medicine | Admitting: Emergency Medicine

## 2016-04-05 DIAGNOSIS — M1711 Unilateral primary osteoarthritis, right knee: Secondary | ICD-10-CM | POA: Insufficient documentation

## 2016-04-05 DIAGNOSIS — M25461 Effusion, right knee: Secondary | ICD-10-CM

## 2016-04-05 DIAGNOSIS — I10 Essential (primary) hypertension: Secondary | ICD-10-CM | POA: Diagnosis not present

## 2016-04-05 DIAGNOSIS — M11261 Other chondrocalcinosis, right knee: Secondary | ICD-10-CM | POA: Diagnosis not present

## 2016-04-05 DIAGNOSIS — M25561 Pain in right knee: Secondary | ICD-10-CM | POA: Diagnosis present

## 2016-04-05 LAB — SYNOVIAL CELL COUNT + DIFF, W/ CRYSTALS
LYMPHOCYTES-SYNOVIAL FLD: 79 % — AB (ref 0–20)
MONOCYTE-MACROPHAGE-SYNOVIAL FLUID: 5 % — AB (ref 50–90)
Neutrophil, Synovial: 16 % (ref 0–25)
WBC, SYNOVIAL: 223 /mm3 — AB (ref 0–200)

## 2016-04-05 MED ORDER — DICLOFENAC SODIUM 1 % TD GEL: 4.0000 g | Freq: Four times a day (QID) | TRANSDERMAL | 0 refills | Status: AC

## 2016-04-05 NOTE — ED Provider Notes (Signed)
[facility] Provider Note   CSN: [id number] Arrival date & time: 04/05/16  1527   By signing my name below, I, Soijett Blue, attest that this documentation has been prepared under the direction and in the presence of Leo Grosser, MD. Electronically Signed: Soijett Blue, ED Scribe. 04/05/16. 4:26 PM.  History   Chief Complaint Chief Complaint  Patient presents with  . Leg Swelling    HPI Jacob Browning is a 76 y.o. male with a PMHx of A-fib, HTN, who presents to the Emergency Department complaining of right leg swelling onset yesterday. Pt reports associated right knee swelling and right lower leg pain. Pt has not tried any medications for the relief of his symptoms. He states that he has obtained steroid injections for his right knee by Dr. Alvan Dame in the past. He notes that he has a hx of gout to his bilateral great toes and bilateral elbows. He denies recent injury and any other symptoms.    The history is provided by the patient. No language interpreter was used.    Past Medical History:  Diagnosis Date  . A-fib ([place])   . Hypertension     There are no active problems to display for this patient.   Past Surgical History:  Procedure Laterality Date  . ATRIAL ABLATION SURGERY         Home Medications    Prior to Admission medications   Medication Sig Start Date End Date Taking? Authorizing Provider  apixaban (ELIQUIS) 5 MG TABS tablet Take 5 mg by mouth 2 (two) times daily.   Yes Historical Provider, MD  losartan (COZAAR) 100 MG tablet Take 100 mg by mouth daily.   Yes Historical Provider, MD  METOPROLOL TARTRATE PO Take by mouth.    Historical Provider, MD  sotalol (BETAPACE) 120 MG tablet Take 120 mg by mouth 2 (two) times daily.    Historical Provider, MD    Family History No family history on file.  Social History Social History  Substance Use Topics  . Smoking status: Never Smoker  . Smokeless tobacco: Never Used  . Alcohol use No      Allergies   Patient has no known allergies.   Review of Systems Review of Systems  All other systems reviewed and are negative.    Physical Exam Updated Vital Signs BP (!) 155/72 (BP Location: Left Arm)   Pulse (!) 57   Temp 97.8 F (36.6 C) (Oral)   Resp (!) 22   Ht 6\' 1"  (1.854 m)   Wt 254 lb (115.2 kg)   SpO2 95%   BMI 33.51 kg/m   Physical Exam  Constitutional: He is oriented to person, place, and time. He appears well-developed and well-nourished. No distress.  HENT:  Head: Normocephalic and atraumatic.  Nose: Nose normal.  Eyes: Conjunctivae are normal.  Neck: Neck supple. No tracheal deviation present.  Cardiovascular: Normal rate and regular rhythm.   Pulmonary/Chest: Effort normal. No respiratory distress.  Abdominal: Soft. He exhibits no distension.  Musculoskeletal:       Right knee: He exhibits effusion. He exhibits no erythema.       Right lower leg: He exhibits edema.       Left lower leg: He exhibits edema.  1+ pitting edema over shin on right. 2+ pitting edema at the level of the knee and 1+ pitting edema on the left. Moderate right knee joint effusion. No warmth or erythema.   Neurological: He is alert and oriented to person,  place, and time.  Skin: Skin is warm and dry.  Psychiatric: He has a normal mood and affect.     ED Treatments / Results  DIAGNOSTIC STUDIES: Oxygen Saturation is 95% on RA, adequate by my interpretation.    COORDINATION OF CARE: 4:26 PM Discussed treatment plan with pt at bedside which includes US extremity lower right, right knee xray, knee tap, and pt agreed to plan.   Labs (all labs ordered are listed, but only abnormal results are displayed) Labs Reviewed  SYNOVIAL CELL COUNT + DIFF, W/ CRYSTALS - Abnormal; Notable for the following:       Result Value   WBC, Synovial 223 (*)    Lymphocytes-Synovial Fld 79 (*)    Monocyte-Macrophage-Synovial Fluid 5 (*)    All other components within normal limits  BODY  FLUID CULTURE    EKG  EKG Interpretation None       Radiology US Venous Img Lower Unilateral Right  Result Date: 04/05/2016 CLINICAL DATA:  Right anterior knee pain and swelling for 3 days EXAM: RIGHT LOWER EXTREMITY VENOUS DOPPLER ULTRASOUND TECHNIQUE: Gray-scale sonography with graded compression, as well as color Doppler and duplex ultrasound were performed to evaluate the lower extremity deep venous systems from the level of the common femoral vein and including the common femoral, femoral, profunda femoral, popliteal and calf veins including the posterior tibial, peroneal and gastrocnemius veins when visible. The superficial great saphenous vein was also interrogated. Spectral Doppler was utilized to evaluate flow at rest and with distal augmentation maneuvers in the common femoral, femoral and popliteal veins. COMPARISON:  None. FINDINGS: Contralateral Common Femoral Vein: Respiratory phasicity is normal and symmetric with the symptomatic side. No evidence of thrombus. Normal compressibility. Common Femoral Vein: No evidence of thrombus. Normal compressibility, respiratory phasicity and response to augmentation. Saphenofemoral Junction: No evidence of thrombus. Normal compressibility and flow on color Doppler imaging. Profunda Femoral Vein: No evidence of thrombus. Normal compressibility and flow on color Doppler imaging. Femoral Vein: No evidence of thrombus. Normal compressibility, respiratory phasicity and response to augmentation. Popliteal Vein: No evidence of thrombus. Normal compressibility, respiratory phasicity and response to augmentation. Calf Veins: No evidence of thrombus. Normal compressibility and flow on color Doppler imaging. Superficial Great Saphenous Vein: No evidence of thrombus. Normal compressibility and flow on color Doppler imaging. Venous Reflux:  None. Other Findings:  Large suprapatellar joint effusion. IMPRESSION: 1. No evidence of deep venous thrombosis of the right  lower extremity. 2. Large suprapatellar joint effusion. Electronically Signed   By: Kathreen Devoid   On: 04/05/2016 17:17   Dg Knee Complete 4 Views Right  Result Date: 04/05/2016 CLINICAL DATA:  Right knee pain posteriorly. EXAM: RIGHT KNEE - COMPLETE 4+ VIEW COMPARISON:  None. FINDINGS: No fracture or dislocation. Large joint effusion. 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 No lytic or sclerotic osseous lesion. Mild medial femorotibial compartment joint space narrowing. Soft tissues are unremarkable. IMPRESSION: 1.  No acute osseous injury of the right knee. 2. Large joint effusion. Electronically Signed   By: Kathreen Devoid   On: 04/05/2016 16:55    Procedures .Joint Aspiration/Arthrocentesis Date/Time: 04/05/2016 5:31 PM Performed by: Leo Grosser Authorized by: Leo Grosser   Consent:    Consent obtained:  Verbal   Consent given by:  Patient   Risks discussed:  Bleeding, infection and pain   Alternatives discussed:  Observation and referral Location:    Location:  Knee   Knee:  R  knee Anesthesia (see MAR for exact dosages):    Anesthesia method:  None Procedure details:    Preparation: Patient was prepped and draped in usual sterile fashion     Needle gauge:  22 G   Approach:  Anterior   Aspirate amount:  10 cc   Aspirate characteristics:  Yellow   Steroid injected: no     Specimen collected: yes   Post-procedure details:    Dressing:  Adhesive bandage   Patient tolerance of procedure:  Tolerated well, no immediate complications   (including critical care time)  Medications Ordered in ED Medications - No data to display   Initial Impression / Assessment and Plan / ED Course  I have reviewed the triage vital signs and the nursing notes.  Pertinent labs & imaging results that were available during my care of the patient were reviewed by me and considered in my medical decision making (see chart for details).      76 year old male with history of gout presents with right knee swelling and pain over the last few days worsening. He is concerned because he had a "ruptured blood vessel" in that knee previously. He is well-appearing and has a large knee joint effusion without signs of infection. No evidence of DVT noted on Doppler and joint is negative for fractures but shows multi-compartmental chondrocalcinosis with some joint space narrowing suggestive of osteoarthritis which is the likely underlying etiology. Due to his history of gout a sample was sent of the synovial fluid to evaluate for crystals. Patient was recommended to take short course of topical NSAIDs and engage in early mobility. Follow-up with Dr. Alvan Dame who has seen previously to consider steroid injection or other outpatient interventions.  Patient had leave prior to the return of the synovial Crystal analysis. It appears that there are calcium prior phosphate crystals and modest PMN elevation that are consistent with pseudogout which would explain his symptoms. I called and relayed these findings reiterating that he follow up with Dr. Alvan Dame as previously discussed.  Final Clinical Impressions(s) / ED Diagnoses   Final diagnoses:  Effusion of right knee joint  Primary osteoarthritis of right knee  Pseudogout of knee, right    New Prescriptions Discharge Medication List as of 04/05/2016  7:24 PM    START taking these medications   Details  diclofenac sodium (VOLTAREN) 1 % GEL Apply 4 g topically 4 (four) times daily., Starting Sun 04/05/2016, Until Sun 04/12/2016, Print       I personally performed the services described in this documentation, which was scribed in my presence. The recorded information has been reviewed and is accurate.     Leo Grosser, MD 04/05/16 [phone]

## 2016-04-05 NOTE — ED Triage Notes (Signed)
Pt reports R leg swelling and pain since yesterday, denies injury.

## 2016-04-05 NOTE — ED Notes (Signed)
Patient transported to X-ray 

## 2016-04-05 NOTE — ED Notes (Signed)
ED Provider at bedside. 

## 2016-04-05 NOTE — ED Notes (Signed)
Pt and family given d/c instructions as per chart. Rx x 1. Verbalizes understanding. No questions. 

## 2016-04-05 NOTE — ED Notes (Signed)
PT returned from xray

## 2016-04-09 LAB — BODY FLUID CULTURE
Culture: NO GROWTH
Gram Stain: NONE SEEN

## 2018-02-21 ENCOUNTER — Emergency Department (HOSPITAL_BASED_OUTPATIENT_CLINIC_OR_DEPARTMENT_OTHER)
Admission: EM | Admit: 2018-02-21 | Discharge: 2018-02-21 | Disposition: A | Discharge status: Home / Self Care | Payer: Medicare Other | Attending: Emergency Medicine | Admitting: Emergency Medicine

## 2018-02-21 ENCOUNTER — Emergency Department (HOSPITAL_BASED_OUTPATIENT_CLINIC_OR_DEPARTMENT_OTHER): Payer: Medicare Other

## 2018-02-21 ENCOUNTER — Encounter (HOSPITAL_BASED_OUTPATIENT_CLINIC_OR_DEPARTMENT_OTHER): Payer: Self-pay

## 2018-02-21 ENCOUNTER — Other Ambulatory Visit: Payer: Self-pay

## 2018-02-21 DIAGNOSIS — Z79899 Other long term (current) drug therapy: Secondary | ICD-10-CM | POA: Diagnosis not present

## 2018-02-21 DIAGNOSIS — X58XXXA Exposure to other specified factors, initial encounter: Secondary | ICD-10-CM | POA: Insufficient documentation

## 2018-02-21 DIAGNOSIS — S39012A Strain of muscle, fascia and tendon of lower back, initial encounter: Secondary | ICD-10-CM | POA: Insufficient documentation

## 2018-02-21 DIAGNOSIS — Y929 Unspecified place or not applicable: Secondary | ICD-10-CM | POA: Insufficient documentation

## 2018-02-21 DIAGNOSIS — R1032 Left lower quadrant pain: Secondary | ICD-10-CM | POA: Diagnosis present

## 2018-02-21 DIAGNOSIS — Y939 Activity, unspecified: Secondary | ICD-10-CM | POA: Diagnosis not present

## 2018-02-21 DIAGNOSIS — I10 Essential (primary) hypertension: Secondary | ICD-10-CM | POA: Diagnosis not present

## 2018-02-21 DIAGNOSIS — Z7901 Long term (current) use of anticoagulants: Secondary | ICD-10-CM | POA: Insufficient documentation

## 2018-02-21 DIAGNOSIS — Y999 Unspecified external cause status: Secondary | ICD-10-CM | POA: Insufficient documentation

## 2018-02-21 DIAGNOSIS — T148XXA Other injury of unspecified body region, initial encounter: Secondary | ICD-10-CM

## 2018-02-21 LAB — CBC WITH DIFFERENTIAL/PLATELET
Abs Immature Granulocytes: 0.02 10*3/uL (ref 0.00–0.07)
Basophils Absolute: 0 10*3/uL (ref 0.0–0.1)
Basophils Relative: 0 %
Eosinophils Absolute: 0.1 10*3/uL (ref 0.0–0.5)
Eosinophils Relative: 1 %
HCT: 43.4 % (ref 39.0–52.0)
Hemoglobin: 13.9 g/dL (ref 13.0–17.0)
Immature Granulocytes: 0 %
LYMPHS PCT: 26 %
Lymphs Abs: 1.9 10*3/uL (ref 0.7–4.0)
MCH: 30.8 pg (ref 26.0–34.0)
MCHC: 32 g/dL (ref 30.0–36.0)
MCV: 96 fL (ref 80.0–100.0)
Monocytes Absolute: 0.6 10*3/uL (ref 0.1–1.0)
Monocytes Relative: 8 %
NEUTROS PCT: 65 %
Neutro Abs: 4.8 10*3/uL (ref 1.7–7.7)
Platelets: 188 10*3/uL (ref 150–400)
RBC: 4.52 MIL/uL (ref 4.22–5.81)
RDW: 12.6 % (ref 11.5–15.5)
WBC: 7.4 10*3/uL (ref 4.0–10.5)
nRBC: 0 % (ref 0.0–0.2)

## 2018-02-21 LAB — URINALYSIS, ROUTINE W REFLEX MICROSCOPIC
BILIRUBIN URINE: NEGATIVE
GLUCOSE, UA: NEGATIVE mg/dL
HGB URINE DIPSTICK: NEGATIVE
Ketones, ur: NEGATIVE mg/dL
Leukocytes,Ua: NEGATIVE
Nitrite: NEGATIVE
PH: 7 (ref 5.0–8.0)
Protein, ur: NEGATIVE mg/dL
SPECIFIC GRAVITY, URINE: 1.015 (ref 1.005–1.030)

## 2018-02-21 LAB — COMPREHENSIVE METABOLIC PANEL
ALT: 19 U/L (ref 0–44)
AST: 16 U/L (ref 15–41)
Albumin: 3.9 g/dL (ref 3.5–5.0)
Alkaline Phosphatase: 65 U/L (ref 38–126)
Anion gap: 5 (ref 5–15)
BUN: 16 mg/dL (ref 8–23)
CHLORIDE: 104 mmol/L (ref 98–111)
CO2: 29 mmol/L (ref 22–32)
Calcium: 8.9 mg/dL (ref 8.9–10.3)
Creatinine, Ser: 1.09 mg/dL (ref 0.61–1.24)
GFR calc Af Amer: >60 mL/min (ref >60)
Glucose, Bld: 93 mg/dL (ref 70–99)
Potassium: 4.4 mmol/L (ref 3.5–5.1)
Sodium: 138 mmol/L (ref 135–145)
Total Bilirubin: 0.6 mg/dL (ref 0.3–1.2)
Total Protein: 6.8 g/dL (ref 6.5–8.1)

## 2018-02-21 MED ORDER — ONDANSETRON HCL 4 MG/2ML IJ SOLN
4.0000 mg | Freq: Once | INTRAMUSCULAR | Status: DC
  Filled 2018-02-21: qty 2

## 2018-02-21 MED ORDER — MORPHINE SULFATE (PF) 4 MG/ML IV SOLN
4.0000 mg | Freq: Once | INTRAVENOUS | Status: DC
  Filled 2018-02-21: qty 1

## 2018-02-21 NOTE — ED Notes (Signed)
Lab notified of lab orders

## 2018-02-21 NOTE — ED Provider Notes (Signed)
[facility] EMERGENCY DEPARTMENT Provider Note   CSN: [id number] Arrival date & time: 02/21/18  1233    History   Chief Complaint Chief Complaint  Patient presents with  . Flank Pain    Jacob Browning is a 78 y.o. male.     Seem Monday at [facility] and Dx with pulled muscle  The history is provided by the patient.  Flank Pain  This is a new problem. The current episode started more than 1 week ago (10 days ago). The problem occurs constantly. The problem has been gradually worsening. Pertinent negatives include no chest pain, no abdominal pain and no shortness of breath. Associated symptoms comments: Occasional left testicle pain. Left knee pain.. The symptoms are aggravated by standing. The symptoms are relieved by lying down. The treatment provided no relief.    Past Medical History:  Diagnosis Date  . A-fib ([place])   . Hypertension     There are no active problems to display for this patient.   Past Surgical History:  Procedure Laterality Date  . ATRIAL ABLATION SURGERY    . CARDIOVERSION    . CATARACT EXTRACTION    . SHOULDER SURGERY          Home Medications    Prior to Admission medications   Medication Sig Start Date End Date Taking? Authorizing Provider  amLODipine (NORVASC) 5 MG tablet Take 5 mg by mouth.   Yes [provider]  apixaban (ELIQUIS) 5 MG TABS tablet Take 5 mg by mouth 2 (two) times daily.   Yes [provider]  losartan (COZAAR) 100 MG tablet Take 100 mg by mouth daily.   Yes [provider]  sotalol (BETAPACE) 120 MG tablet Take 120 mg by mouth 2 (two) times daily.   Yes [provider]  tamsulosin (FLOMAX) 0.4 MG CAPS capsule Take 0.4 mg by mouth daily after supper.   Yes [provider]  METOPROLOL TARTRATE PO Take by mouth.    [provider]    Family History No family history on file.  Social History Social History   Tobacco Use  . Smoking status:  Never Smoker  . Smokeless tobacco: Never Used  Substance Use Topics  . Alcohol use: No  . Drug use: No     Allergies   Patient has no known allergies.   Review of Systems Review of Systems  Constitutional: Negative for activity change and fever.       All ROS Neg except as noted in Jacob  HENT: Negative for nosebleeds.   Eyes: Negative for photophobia and discharge.  Respiratory: Negative for cough, shortness of breath and wheezing.   Cardiovascular: Negative for chest pain and palpitations.  Gastrointestinal: Negative for abdominal pain and blood in stool.  Genitourinary: Positive for flank pain and testicular pain. Negative for discharge, dysuria, frequency, hematuria and scrotal swelling.  Musculoskeletal: Positive for arthralgias and back pain. Negative for neck pain.  Skin: Negative.   Neurological: Negative for dizziness, seizures and speech difficulty.  Psychiatric/Behavioral: Negative for confusion and hallucinations.     Physical Exam Updated Vital Signs BP 135/71 (BP Location: Left Arm)   Pulse (!) 59   Temp 98 F (36.7 C)   Resp 14   Ht 6\' 1"  (1.854 m)   Wt 115.7 kg   SpO2 97%   BMI 33.64 kg/m   Physical Exam Vitals signs and nursing note reviewed.  Constitutional:      Appearance: He is well-developed. He  is not toxic-appearing.  HENT:     Head: Normocephalic.     Right Ear: Tympanic membrane and external ear normal.     Left Ear: Tympanic membrane and external ear normal.  Eyes:     General: Lids are normal.     Pupils: Pupils are equal, round, and reactive to light.  Neck:     Musculoskeletal: Normal range of motion and neck supple.     Vascular: No carotid bruit.  Cardiovascular:     Rate and Rhythm: Normal rate and regular rhythm.     Pulses: Normal pulses.     Heart sounds: Normal heart sounds.  Pulmonary:     Effort: No respiratory distress.     Breath sounds: Normal breath sounds.  Abdominal:     General: Bowel sounds are normal.      Palpations: Abdomen is soft.     Tenderness: There is no abdominal tenderness. There is no guarding.  Musculoskeletal: Normal range of motion.     Lumbar back: He exhibits pain and spasm.       Back:  Lymphadenopathy:     Head:     Right side of head: No submandibular adenopathy.     Left side of head: No submandibular adenopathy.     Cervical: No cervical adenopathy.  Skin:    General: Skin is warm and dry.  Neurological:     Mental Status: He is alert and oriented to person, place, and time.     Cranial Nerves: No cranial nerve deficit.     Sensory: No sensory deficit.  Psychiatric:        Speech: Speech normal.      ED Treatments / Results  Labs (all labs ordered are listed, but only abnormal results are displayed) Labs Reviewed  URINALYSIS, ROUTINE W REFLEX MICROSCOPIC    EKG None  Radiology No results found.  Procedures Procedures (including critical care time)  Medications Ordered in ED Medications - No data to display   Initial Impression / Assessment and Plan / ED Course  I have reviewed the triage vital signs and the nursing notes.  Pertinent labs & imaging results that were available during my care of the patient were reviewed by me and considered in my medical decision making (see chart for details).        Pt seen with me by Dr Rogene Houston.  Final Clinical Impressions(s) / ED Diagnoses MDM  Pain is reproduced with attempted range of motion and also with palpation on the left lower back and flank area.  The complete blood count and chemistries are within normal limits.  The urine analysis is negative for infection or other acute problems.  CT scan stone study is negative for kidney stone, shifting within the abdomen, or other acute changes.  The patient is ambulatory in the room as well as in the hallway with minimal problem.  Examination favors muscle strain, however the possibility of this being related to his left knee is also entertained.  I  have asked the patient to be followed by his orthopedic, as well as his primary physician.  The patient will continue his current medications and use a heating pad to the area.  He will see his primary physician or return to the emergency department if any changes in his condition, problems, or concerns.   Final diagnoses:  Muscle strain    ED Discharge Orders    None       Lily Kocher, [place] 02/21/18 1627  Fredia Sorrow, MD 02/23/18 [phone]

## 2018-02-21 NOTE — Discharge Instructions (Addendum)
There are no acute changes in your vital signs.  Your oxygen level is 95% on room air, which is within normal limits.  The complete blood count and the chemistries are within normal limits.  Show no signs of an infection, or an electrolyte imbalance or other problems.  There are no changes in the kidney function.  Your urine analysis is negative for urinary tract infection.  The CT scan is negative for kidney stone, hematoma, mass, or other problems on that side.  The pain can be reproduced by palpation and by certain movements.  This is more consistent with a muscle strain or related to you left hip arthritis.  Please continue the muscle relaxer.  Please use Tylenol extra strength every 4 hours.  Please use a heating pad to the area.  Please use caution with a heating pad.  Please see Dr. Levin Erp your physicians at the [facility] for additional evaluation and management. Please return to the Emergency Dept if any changes in your condition, problems or concerns.

## 2018-02-21 NOTE — ED Triage Notes (Addendum)
C/o left flank pain since 2/7 one day after having cardioversion-left testicle pain day after flank pain started-pt states [facility]-dx muscle strain-no relief-NAD-steady gait

## 2018-02-21 NOTE — ED Notes (Signed)
Pain to left flank only.

## 2018-02-21 NOTE — ED Provider Notes (Signed)
Medical screening examination/treatment/procedure(s) were conducted as a shared visit with non-physician practitioner(s) and myself.  I personally evaluated the patient during the encounter.  None   Work-up here today for the left-sided flank pain now without any acute findings.  Labs are normal electrolytes are normal kidney functions normal CT scan showed no evidence of kidney stone patient was concerned about that.  Rest of the abdominal scan also without any acute abnormalities.  It is possible based on the location low on the flank that it could be related to the left hip there has not been any fall.  Patient does have bad knees so that he sometimes walks funny is a possibility as a cause.  He is followed by the [place].  Patient stable for discharge home.  Abdomen without any acute abdominal process.   Fredia Sorrow, MD 02/21/18 [phone]

## 2018-02-21 NOTE — ED Notes (Signed)
ED Provider at bedside. 

## 2018-12-26 IMAGING — US US EXTREM LOW VENOUS*R*
1 series · 13 of 24 positions shown · non-contrast
Comparison: None.

CLINICAL DATA: Right anterior knee pain and swelling for 3 days



[Series 1: us extrem low venous*right* · 0.08mm/px · 13 of 49 slices shown]
[im 1/49]
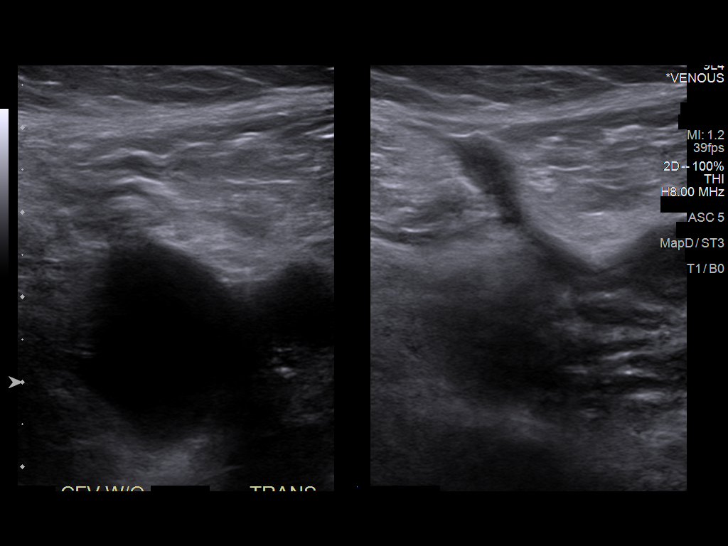
[im 5/49]
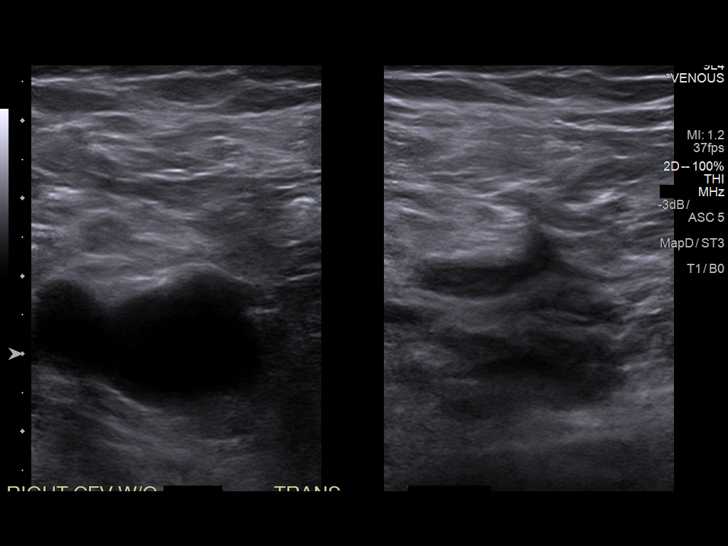
[im 9/49]
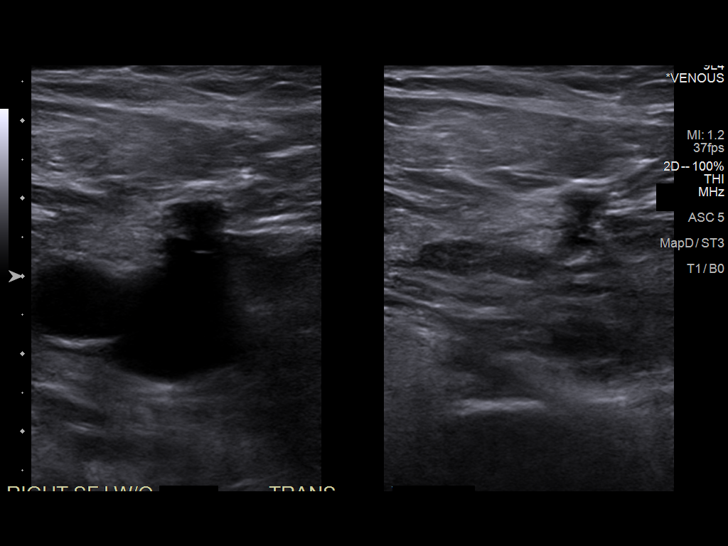
[im 13/49]
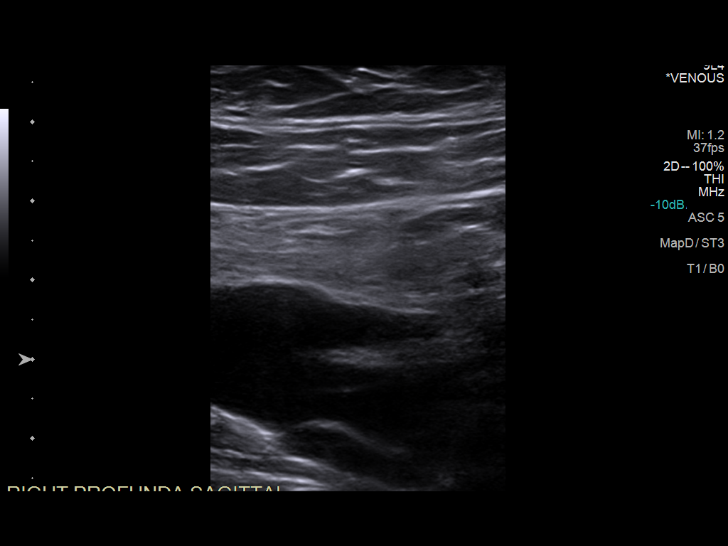
[im 17/49]
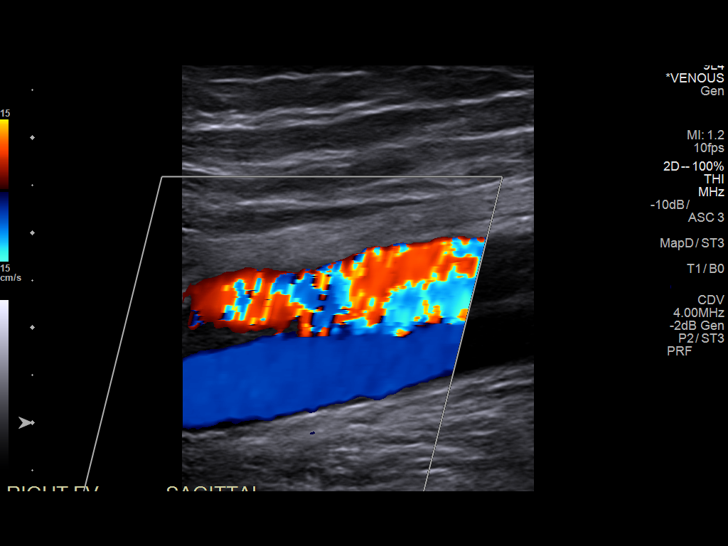
[im 21/49]
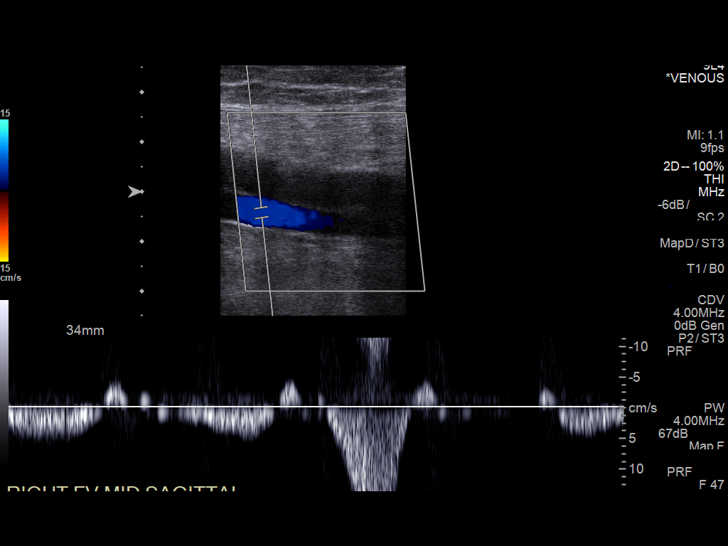
[im 26/49]
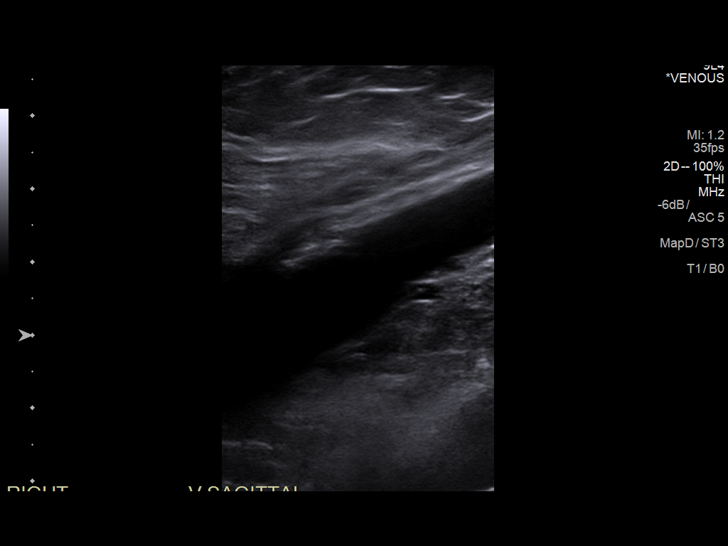
[im 28/49]
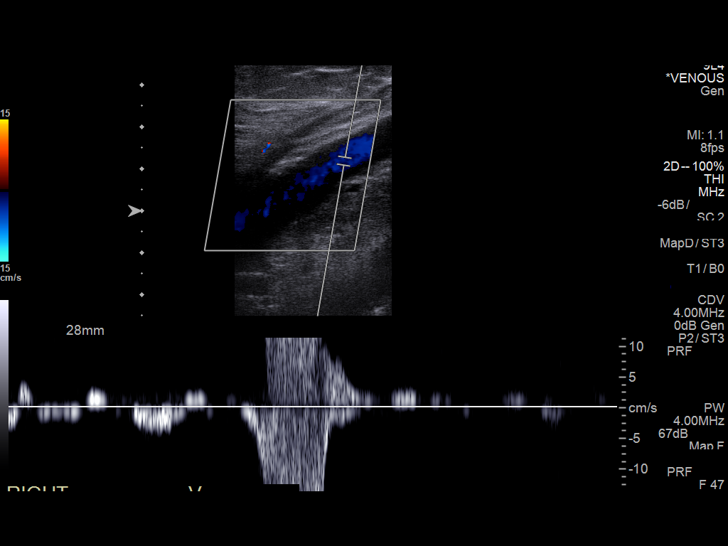
[im 32/49]
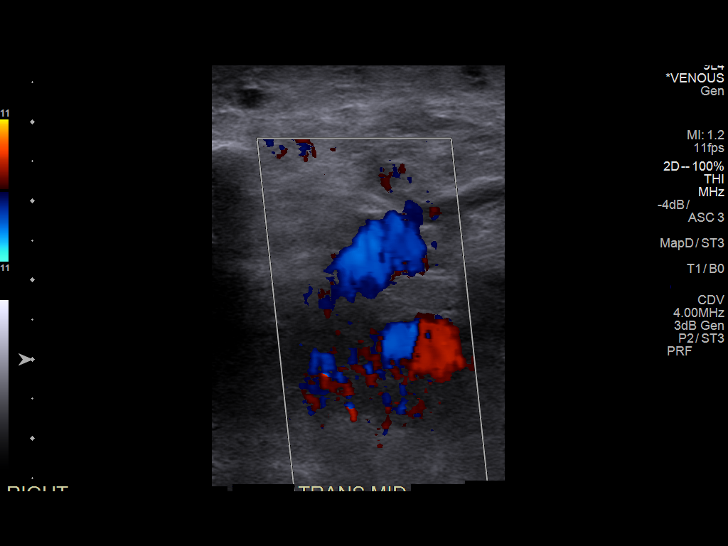
[im 36/49]
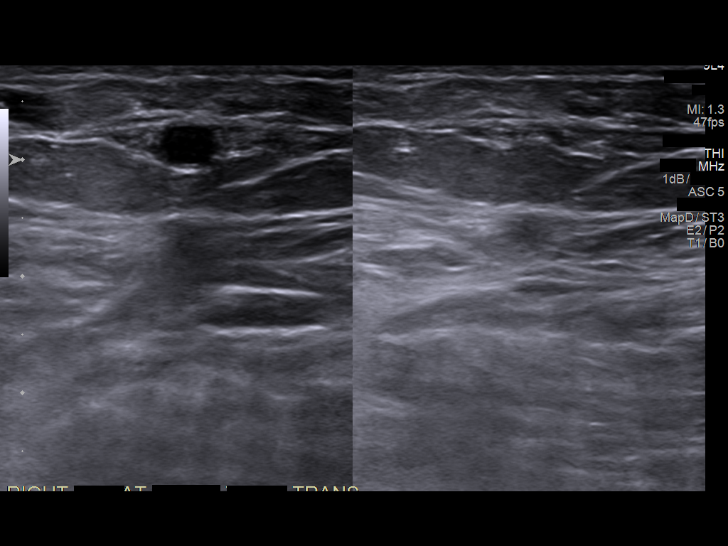
[im 40/49]
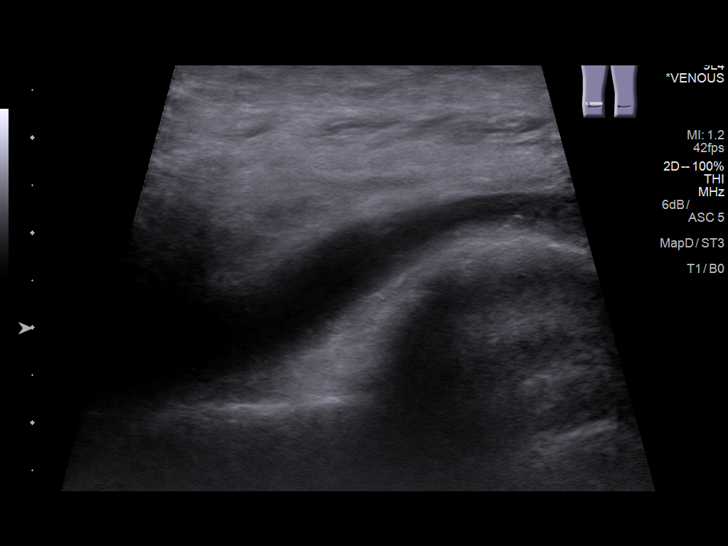
[im 44/49]
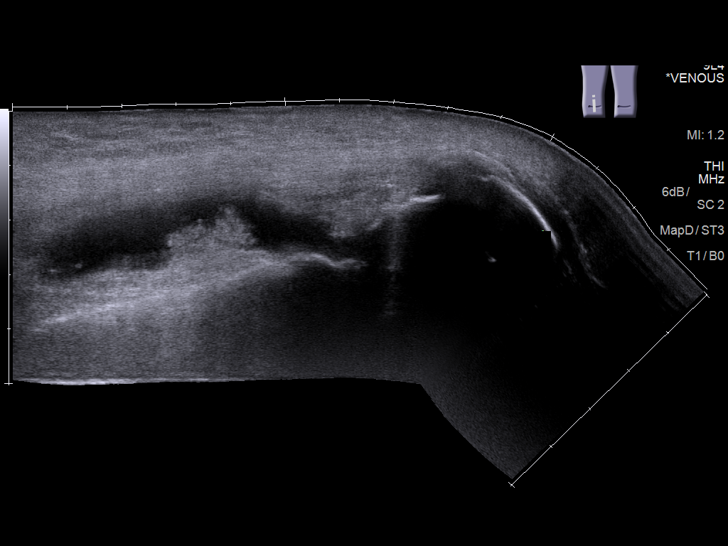
[im 49/49]
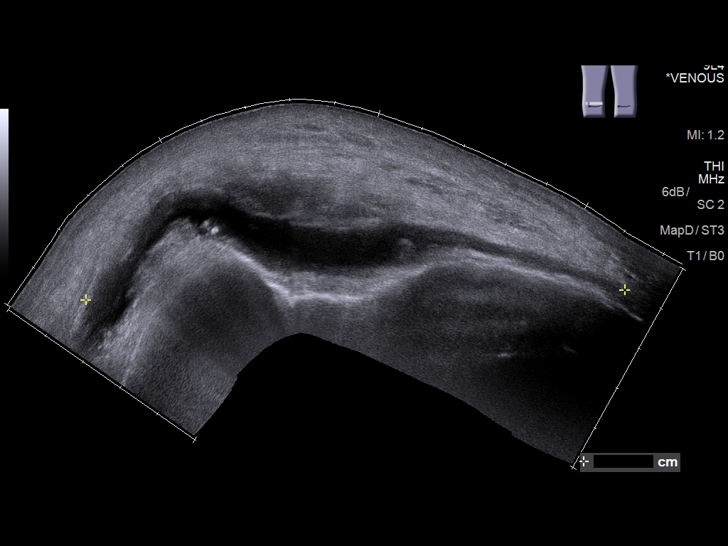

[13 of 24 positions shown; findings below may reference images not displayed]

FINDINGS: Contralateral Common Femoral Vein: Respiratory phasicity is normal
and symmetric with the symptomatic side. No evidence of thrombus.
Normal compressibility.

Common Femoral Vein: No evidence of thrombus. Normal
compressibility, respiratory phasicity and response to augmentation.

Saphenofemoral Junction: No evidence of thrombus. Normal
compressibility and flow on color Doppler imaging.

Profunda Femoral Vein: No evidence of thrombus. Normal
compressibility and flow on color Doppler imaging.

Femoral Vein: No evidence of thrombus. Normal compressibility,
respiratory phasicity and response to augmentation.

Popliteal Vein: No evidence of thrombus. Normal compressibility,
respiratory phasicity and response to augmentation.

Calf Veins: No evidence of thrombus. Normal compressibility and flow
on color Doppler imaging.

Superficial Great Saphenous Vein: No evidence of thrombus. Normal
compressibility and flow on color Doppler imaging.

Venous Reflux:  None.

Other Findings:  Large suprapatellar joint effusion.
IMPRESSION: 1. No evidence of deep venous thrombosis of the right lower
extremity.
2. Large suprapatellar joint effusion.

## 2020-01-29 IMAGING — CT CT RENAL STONE PROTOCOL
2 of 4 series · 14 of 46 positions shown, 16 images · non-contrast
Comparison: No prior CT
COMPARISON: No prior CT

Addendum:
CLINICAL DATA: 78-year-old male with a history of flank pain

EXAM:
CT ABDOMEN AND PELVIS WITHOUT CONTRAST
TECHNIQUE: Multidetector CT imaging of the abdomen and pelvis was performed
following the standard protocol without IV contrast.

[Series 2: axial st · axial · 0.97mm/px · z∈[+201,+621]mm · 11 of 102 slices shown, 13 images]
[im 9/102  soft-tissue]
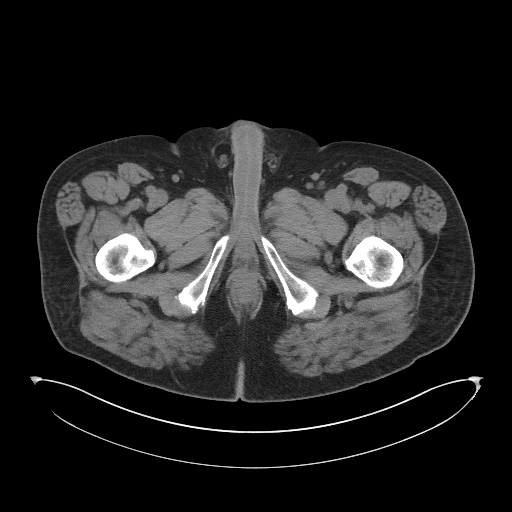
[im 9/102  bone]
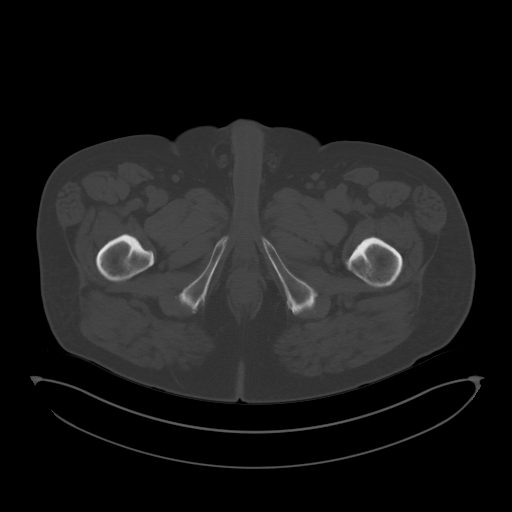
[im 17/102  soft-tissue]
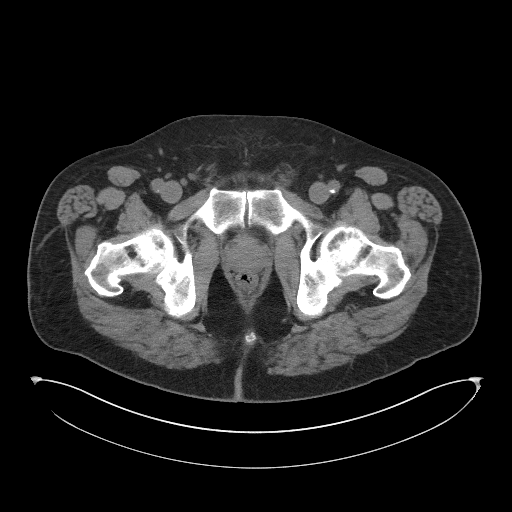
[im 26/102  soft-tissue]
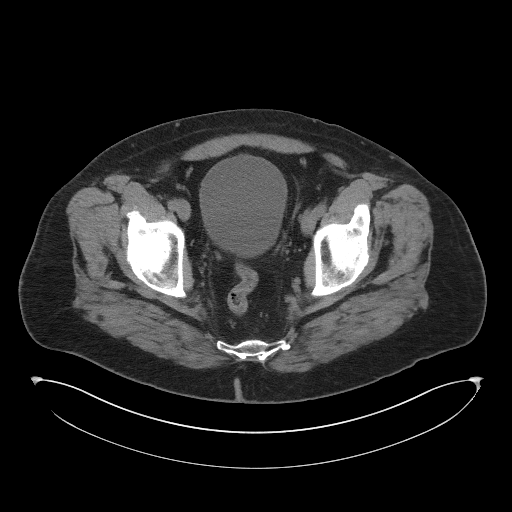
[im 34/102  soft-tissue]
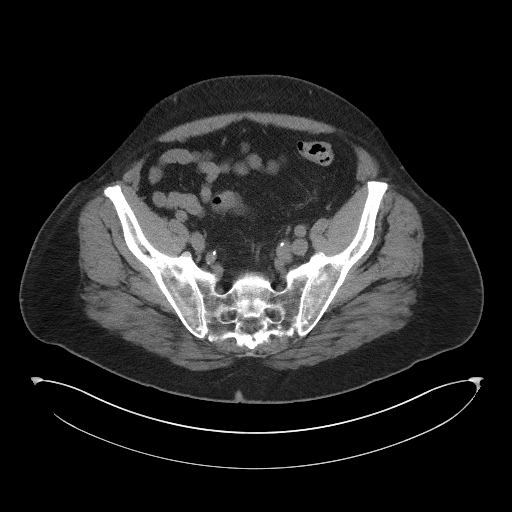
[im 43/102  soft-tissue]
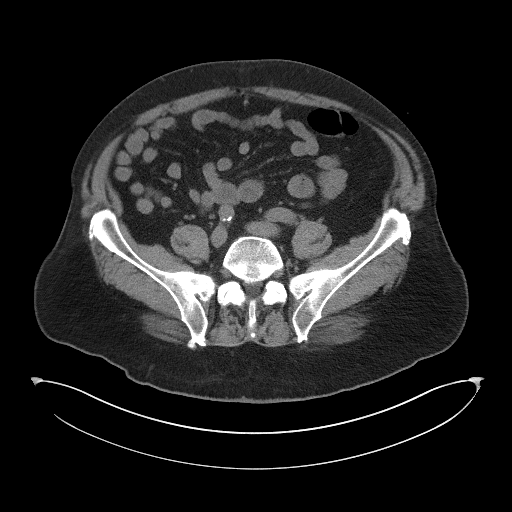
[im 51/102  soft-tissue]
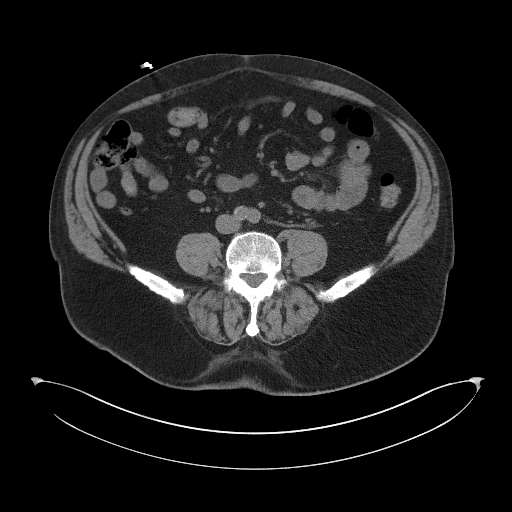
[im 59/102  soft-tissue]
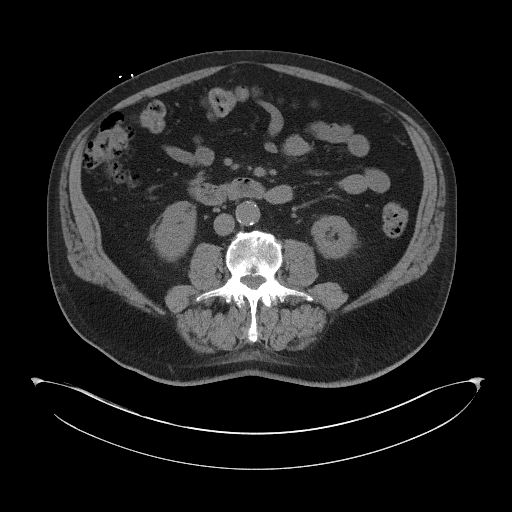
[im 68/102  soft-tissue]
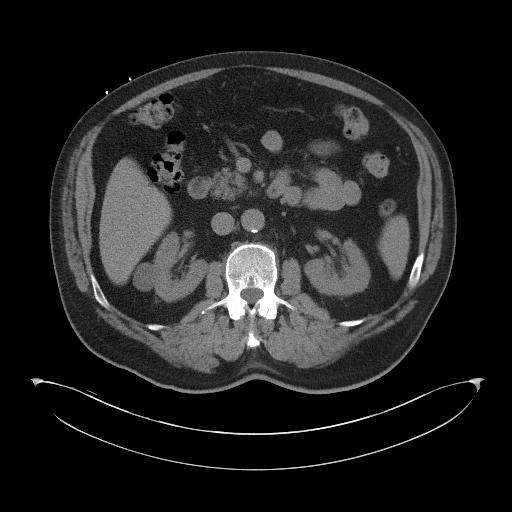
[im 76/102  soft-tissue]
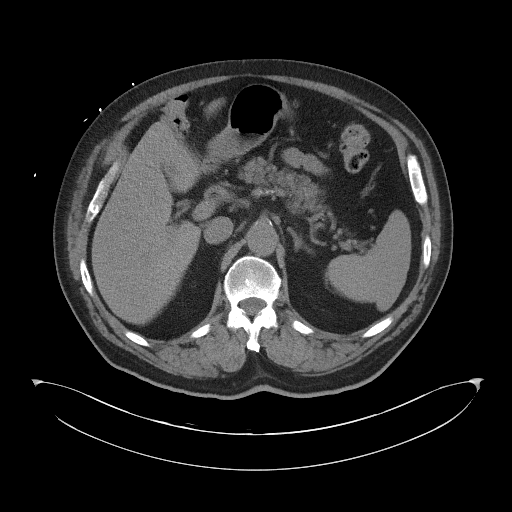
[im 76/102  bone]
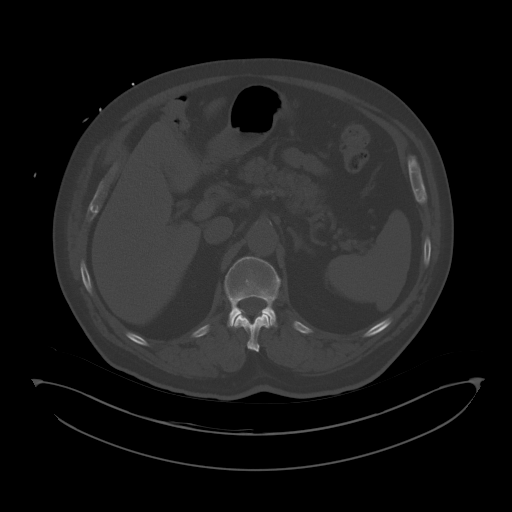
[im 85/102  soft-tissue]
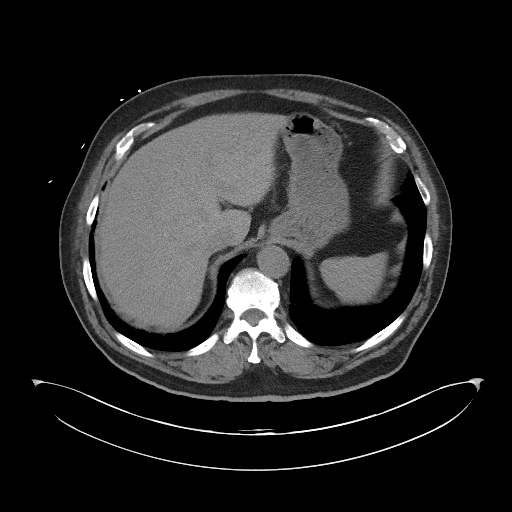
[im 93/102  soft-tissue]
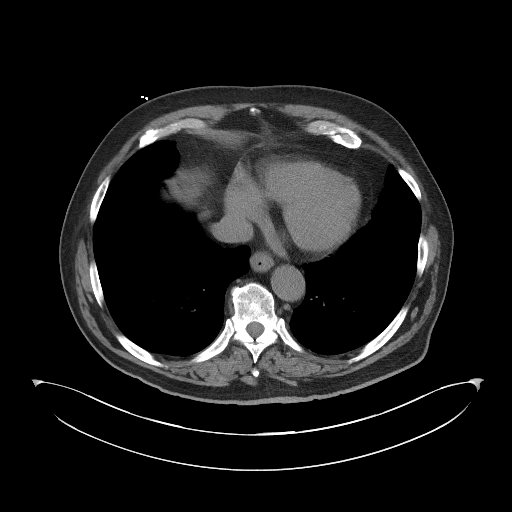

[Series 5: coronal st · coronal · 0.89mm/px · 3 of 115 slices shown]
[im 39/115  soft-tissue]
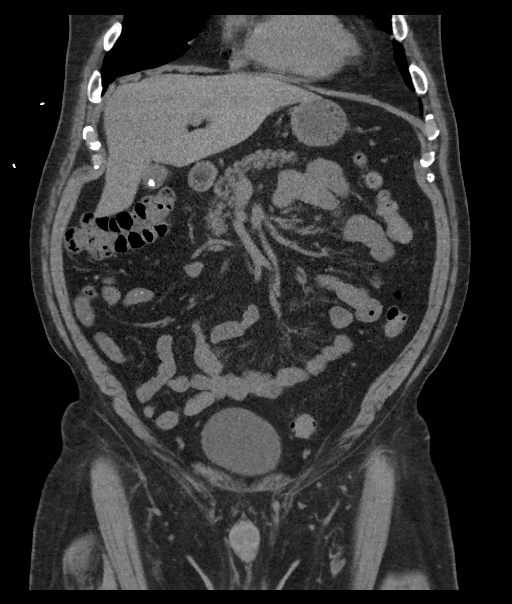
[im 51/115  soft-tissue]
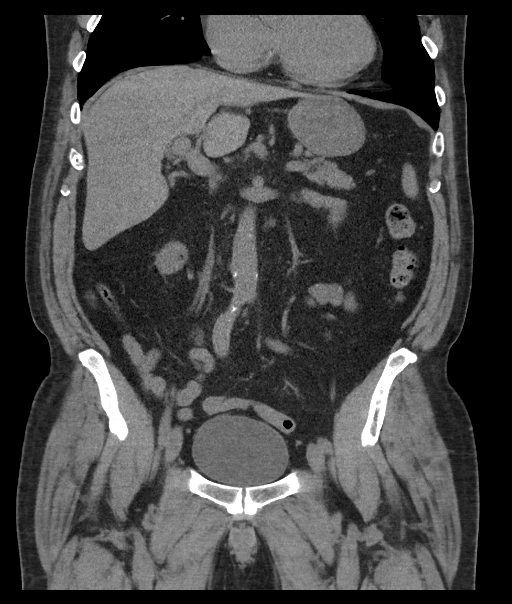
[im 64/115  soft-tissue]
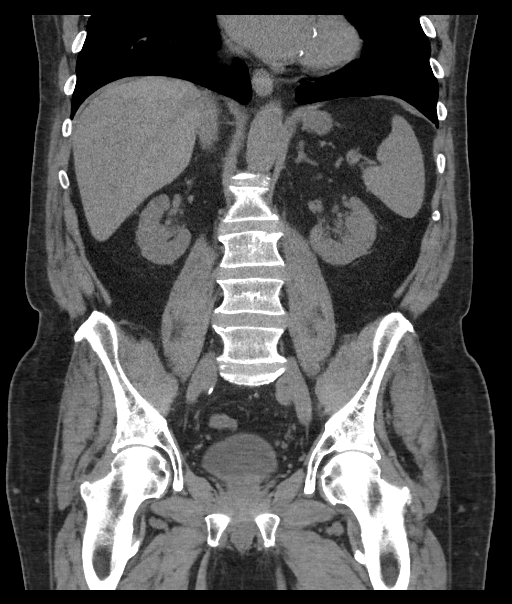

[14 of 46 positions shown; findings below may reference images not displayed]

FINDINGS: Lower chest: No acute finding of the lower chest

Hepatobiliary: Unremarkable appearance of liver. Calcified
gallstones without evidence of acute inflammatory changes.

Pancreas: Unremarkable pancreas

Spleen: Unremarkable spleen

Adrenals/Urinary Tract: Unremarkable adrenal glands.

Bilateral kidneys demonstrate no evidence of nephrolithiasis or
hydronephrosis. The course the bilateral ureters unremarkable with
no radiopaque stones.

Fluid density structure on the lateral cortex of the left kidney
measures 2.9 cm, nonspecific though likely benign. No significant
perinephric stranding on the left or the right.

Urinary bladder partially distended.

Stomach/Bowel: Unremarkable stomach. Unremarkable small bowel. No
abnormal distention. No focal inflammatory changes. Normal appendix.
Diverticular change without evidence of acute diverticulitis.

Vascular/Lymphatic: Mild atherosclerotic changes of the abdominal
aorta and iliac arteries.

No retroperitoneal or peritoneal adenopathy.

Reproductive: Unremarkable pelvic organs.

Other: No abdominal wall hernia.

Musculoskeletal: No acute displaced fracture. Degenerative changes
of the spine. Greatest degree of disc space narrowing at L5-S1.
IMPRESSION: No acute CT finding.

No evidence of nephrolithiasis or hydronephrosis.

Cholelithiasis without evidence of acute cholecystitis.

Diverticular disease without evidence of acute diverticulitis.

Aortic Atherosclerosis ([id number]).

ADDENDUM:
The original report was by Dr. Carlos Alberto Saylor. The following addendum
is by Dr. Dabao Musang:

Dr. Haddad met with me in the reading room regarding this case. The
question is whether the renal cyst mentioned in the report is on the
right or left side. After reviewing the specific images of the of
the kidneys, including a right renal cyst marked at 2.9 cm in long
axis, the cyst mentioned in the body of Dr. Favor report is on
the right side and not the left. Accordingly, the sentence dealing
with this lesion in the body of the report should read, "Fluid
density structure on the lateral cortex of the RIGHT kidney measures
2.9 cm, nonspecific though likely benign."

*** End of Addendum ***
FINDINGS: Lower chest: No acute finding of the lower chest

Hepatobiliary: Unremarkable appearance of liver. Calcified
gallstones without evidence of acute inflammatory changes.

Pancreas: Unremarkable pancreas

Spleen: Unremarkable spleen

Adrenals/Urinary Tract: Unremarkable adrenal glands.

Bilateral kidneys demonstrate no evidence of nephrolithiasis or
hydronephrosis. The course the bilateral ureters unremarkable with
no radiopaque stones.

Fluid density structure on the lateral cortex of the left kidney
measures 2.9 cm, nonspecific though likely benign. No significant
perinephric stranding on the left or the right.

Urinary bladder partially distended.

Stomach/Bowel: Unremarkable stomach. Unremarkable small bowel. No
abnormal distention. No focal inflammatory changes. Normal appendix.
Diverticular change without evidence of acute diverticulitis.

Vascular/Lymphatic: Mild atherosclerotic changes of the abdominal
aorta and iliac arteries.

No retroperitoneal or peritoneal adenopathy.

Reproductive: Unremarkable pelvic organs.

Other: No abdominal wall hernia.

Musculoskeletal: No acute displaced fracture. Degenerative changes
of the spine. Greatest degree of disc space narrowing at L5-S1.
IMPRESSION: No acute CT finding.

No evidence of nephrolithiasis or hydronephrosis.

Cholelithiasis without evidence of acute cholecystitis.

Diverticular disease without evidence of acute diverticulitis.

Aortic Atherosclerosis ([id number]).

## 2020-10-16 ENCOUNTER — Telehealth: Payer: Self-pay | Admitting: Internal Medicine

## 2022-01-19 DIAGNOSIS — E785 Hyperlipidemia, unspecified: Secondary | ICD-10-CM | POA: Diagnosis not present

## 2022-01-19 DIAGNOSIS — Z79899 Other long term (current) drug therapy: Secondary | ICD-10-CM | POA: Diagnosis not present

## 2022-01-19 DIAGNOSIS — I4891 Unspecified atrial fibrillation: Secondary | ICD-10-CM | POA: Diagnosis not present

## 2022-01-19 DIAGNOSIS — B0229 Other postherpetic nervous system involvement: Secondary | ICD-10-CM | POA: Diagnosis not present

## 2022-01-19 DIAGNOSIS — Z125 Encounter for screening for malignant neoplasm of prostate: Secondary | ICD-10-CM | POA: Diagnosis not present

## 2022-01-19 DIAGNOSIS — I10 Essential (primary) hypertension: Secondary | ICD-10-CM | POA: Diagnosis not present

## 2022-01-20 DIAGNOSIS — E785 Hyperlipidemia, unspecified: Secondary | ICD-10-CM | POA: Diagnosis not present

## 2022-01-20 DIAGNOSIS — I10 Essential (primary) hypertension: Secondary | ICD-10-CM | POA: Diagnosis not present

## 2022-01-20 DIAGNOSIS — Z79899 Other long term (current) drug therapy: Secondary | ICD-10-CM | POA: Diagnosis not present

## 2022-01-20 DIAGNOSIS — Z125 Encounter for screening for malignant neoplasm of prostate: Secondary | ICD-10-CM | POA: Diagnosis not present

## 2022-01-28 DIAGNOSIS — K219 Gastro-esophageal reflux disease without esophagitis: Secondary | ICD-10-CM | POA: Diagnosis not present

## 2022-01-28 DIAGNOSIS — H9193 Unspecified hearing loss, bilateral: Secondary | ICD-10-CM | POA: Diagnosis not present

## 2022-01-28 DIAGNOSIS — M1990 Unspecified osteoarthritis, unspecified site: Secondary | ICD-10-CM | POA: Diagnosis not present

## 2022-01-28 DIAGNOSIS — E785 Hyperlipidemia, unspecified: Secondary | ICD-10-CM | POA: Diagnosis not present

## 2022-01-28 DIAGNOSIS — I10 Essential (primary) hypertension: Secondary | ICD-10-CM | POA: Diagnosis not present

## 2022-01-28 DIAGNOSIS — I4891 Unspecified atrial fibrillation: Secondary | ICD-10-CM | POA: Diagnosis not present

## 2022-01-28 DIAGNOSIS — G8929 Other chronic pain: Secondary | ICD-10-CM | POA: Diagnosis not present

## 2022-01-28 DIAGNOSIS — B0223 Postherpetic polyneuropathy: Secondary | ICD-10-CM | POA: Diagnosis not present

## 2022-01-28 DIAGNOSIS — J309 Allergic rhinitis, unspecified: Secondary | ICD-10-CM | POA: Diagnosis not present

## 2022-01-28 DIAGNOSIS — G4733 Obstructive sleep apnea (adult) (pediatric): Secondary | ICD-10-CM | POA: Diagnosis not present

## 2022-01-28 DIAGNOSIS — E559 Vitamin D deficiency, unspecified: Secondary | ICD-10-CM | POA: Diagnosis not present

## 2022-01-28 DIAGNOSIS — E669 Obesity, unspecified: Secondary | ICD-10-CM | POA: Diagnosis not present

## 2022-07-28 DIAGNOSIS — I4891 Unspecified atrial fibrillation: Secondary | ICD-10-CM | POA: Diagnosis not present

## 2022-07-28 DIAGNOSIS — I10 Essential (primary) hypertension: Secondary | ICD-10-CM | POA: Diagnosis not present

## 2022-07-28 DIAGNOSIS — E785 Hyperlipidemia, unspecified: Secondary | ICD-10-CM | POA: Diagnosis not present

## 2022-07-28 DIAGNOSIS — B0229 Other postherpetic nervous system involvement: Secondary | ICD-10-CM | POA: Diagnosis not present

## 2023-01-28 DIAGNOSIS — B0229 Other postherpetic nervous system involvement: Secondary | ICD-10-CM | POA: Diagnosis not present

## 2023-01-28 DIAGNOSIS — I4891 Unspecified atrial fibrillation: Secondary | ICD-10-CM | POA: Diagnosis not present

## 2023-01-28 DIAGNOSIS — I10 Essential (primary) hypertension: Secondary | ICD-10-CM | POA: Diagnosis not present

## 2023-01-28 DIAGNOSIS — R7301 Impaired fasting glucose: Secondary | ICD-10-CM | POA: Diagnosis not present

## 2023-01-28 DIAGNOSIS — E785 Hyperlipidemia, unspecified: Secondary | ICD-10-CM | POA: Diagnosis not present

## 2023-01-29 DIAGNOSIS — Z9181 History of falling: Secondary | ICD-10-CM | POA: Diagnosis not present

## 2023-01-29 DIAGNOSIS — Z0000 Encounter for general adult medical examination without abnormal findings: Secondary | ICD-10-CM | POA: Diagnosis not present

## 2023-05-04 DIAGNOSIS — J069 Acute upper respiratory infection, unspecified: Secondary | ICD-10-CM | POA: Diagnosis not present

## 2023-05-04 DIAGNOSIS — R051 Acute cough: Secondary | ICD-10-CM | POA: Diagnosis not present

## 2023-07-16 DIAGNOSIS — J069 Acute upper respiratory infection, unspecified: Secondary | ICD-10-CM | POA: Diagnosis not present

## 2023-07-16 DIAGNOSIS — R0982 Postnasal drip: Secondary | ICD-10-CM | POA: Diagnosis not present

## 2023-07-16 DIAGNOSIS — R051 Acute cough: Secondary | ICD-10-CM | POA: Diagnosis not present

## 2023-07-16 DIAGNOSIS — R0981 Nasal congestion: Secondary | ICD-10-CM | POA: Diagnosis not present

## 2023-07-29 DIAGNOSIS — D531 Other megaloblastic anemias, not elsewhere classified: Secondary | ICD-10-CM | POA: Diagnosis not present

## 2023-07-29 DIAGNOSIS — I10 Essential (primary) hypertension: Secondary | ICD-10-CM | POA: Diagnosis not present

## 2023-07-29 DIAGNOSIS — D649 Anemia, unspecified: Secondary | ICD-10-CM | POA: Diagnosis not present

## 2023-07-29 DIAGNOSIS — E785 Hyperlipidemia, unspecified: Secondary | ICD-10-CM | POA: Diagnosis not present

## 2023-07-29 DIAGNOSIS — I4891 Unspecified atrial fibrillation: Secondary | ICD-10-CM | POA: Diagnosis not present

## 2023-07-29 DIAGNOSIS — R7301 Impaired fasting glucose: Secondary | ICD-10-CM | POA: Diagnosis not present
